# Patient Record
Sex: Male | Born: 1995 | Race: White | Hispanic: No | Marital: Single | Smoking: Former smoker
Health system: Southern US, Community
[De-identification: ages and names within clinical notes are randomized; demographics above are authoritative.]

## PROBLEM LIST (undated history)

## (undated) ENCOUNTER — Emergency Department: Admission: EM | Discharge status: Against Medical Advice | Payer: Self-pay | Source: Home / Self Care

## (undated) DIAGNOSIS — R569 Unspecified convulsions: Secondary | ICD-10-CM

## (undated) DIAGNOSIS — J9819 Other pulmonary collapse: Secondary | ICD-10-CM

## (undated) HISTORY — PX: OTHER SURGICAL HISTORY: SHX169

## (undated) HISTORY — PX: TONSILLECTOMY: SUR1361

---
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
## 2015-05-06 ENCOUNTER — Encounter: Payer: Self-pay | Admitting: Emergency Medicine

## 2015-05-06 ENCOUNTER — Emergency Department
Admission: EM | Admit: 2015-05-06 | Discharge: 2015-05-06 | Disposition: A | Discharge status: Home / Self Care | Payer: Medicaid Other | Attending: Emergency Medicine | Admitting: Emergency Medicine

## 2015-05-06 DIAGNOSIS — Z7722 Contact with and (suspected) exposure to environmental tobacco smoke (acute) (chronic): Secondary | ICD-10-CM | POA: Insufficient documentation

## 2015-05-06 DIAGNOSIS — K0889 Other specified disorders of teeth and supporting structures: Secondary | ICD-10-CM | POA: Insufficient documentation

## 2015-05-06 DIAGNOSIS — R569 Unspecified convulsions: Secondary | ICD-10-CM | POA: Diagnosis not present

## 2015-05-06 DIAGNOSIS — K029 Dental caries, unspecified: Secondary | ICD-10-CM

## 2015-05-06 MED ORDER — PENICILLIN V POTASSIUM 500 MG PO TABS: 500.0000 mg | ORAL_TABLET | Freq: Four times a day (QID) | ORAL | Status: DC

## 2015-05-06 MED ORDER — IBUPROFEN 800 MG PO TABS
800.0000 mg | ORAL_TABLET | Freq: Once | ORAL | Status: AC
  Administered 2015-05-06: 800 mg via ORAL
  Filled 2015-05-06: qty 1

## 2015-05-06 MED ORDER — IBUPROFEN 800 MG PO TABS: 800.0000 mg | ORAL_TABLET | Freq: Three times a day (TID) | ORAL | Status: DC | PRN

## 2015-05-06 NOTE — ED Notes (Signed)
Pt reports right upper side of mouth pain since last night, reports hole in tooth.

## 2015-05-06 NOTE — ED Provider Notes (Signed)
[facility] Emergency Department Provider Note ____________________________________________  Time seen: 1515  I have reviewed the triage vital signs and the nursing notes.  HISTORY  Chief Complaint  Dental Pain  HPI Jesse Graves is a 20 y.o. male presents to the ED for evaluation and management of pain to the right upper molar with onset of pain yesterday. He describes he was seen by the provider about 6 months earlier and had a filling placed in this right second molar. He claims he attempted to call the dental provider at some point following the procedure, to have the filling removed. He presents now with increased pain to the right upper jaw without obvious abscess, draining, or acute dental fracture.He ninth entered fevers, chills, sweats. He rates pain and discomfort at a 10/10 in triage.  Past Medical History  Diagnosis Date  . Seizures (HCC)     There are no active problems to display for this patient.   Past Surgical History  Procedure Laterality Date  . Tonsillectomy    . Tubes in ears      Current Outpatient Rx  Name  Route  Sig  Dispense  Refill  . cephALEXin (KEFLEX) 500 MG capsule   Oral   Take 1 capsule (500 mg total) by mouth 2 (two) times daily.   20 capsule   0   . ibuprofen (ADVIL,MOTRIN) 800 MG tablet   Oral   Take 1 tablet (800 mg total) by mouth every 8 (eight) hours as needed.   30 tablet   0   . ondansetron (ZOFRAN) 4 MG tablet   Oral   Take 1 tablet (4 mg total) by mouth every 6 (six) hours as needed for nausea or vomiting.   15 tablet   0   . penicillin v potassium (VEETID) 500 MG tablet   Oral   Take 1 tablet (500 mg total) by mouth 4 (four) times daily.   40 tablet   0   . sulfamethoxazole-trimethoprim (BACTRIM DS,SEPTRA DS) 800-160 MG tablet   Oral   Take 2 tablets by mouth 2 (two) times daily.   40 tablet   0    Allergies Review of patient's allergies indicates no known allergies.  No family  history on file.  Social History Social History  Substance Use Topics  . Smoking status: Passive Smoke Exposure - Never Smoker  . Smokeless tobacco: Current User  . Alcohol Use: No    Review of Systems  Constitutional: Negative for fever. Eyes: Negative for visual changes. ENT: Negative for sore throat. Dental pain as above. Cardiovascular: Negative for chest pain. Respiratory: Negative for shortness of breath. Musculoskeletal: Negative for back pain. Skin: Negative for rash. Neurological: Negative for headaches, focal weakness or numbness. ____________________________________________  PHYSICAL EXAM:  VITAL SIGNS: ED Triage Vitals  Enc Vitals Group     BP 05/06/15 1341 123/96 mmHg     Pulse Rate 05/06/15 1341 79     Resp 05/06/15 1341 16     Temp 05/06/15 1341 97.6 F (36.4 C)     Temp Source 05/06/15 1341 Oral     SpO2 05/06/15 1341 98 %     Weight 05/06/15 1341 140 lb (63.504 kg)     Height 05/06/15 1341 6\' 2"  (1.88 m)     Head Cir --      Peak Flow --      Pain Score 05/06/15 1342 10     Pain Loc --      Pain  Edu? --      Excl. in GC? --    Constitutional: Alert and oriented. Well appearing and in no distress. Head: Normocephalic and atraumatic.      Eyes: Conjunctivae are normal. PERRL. Normal extraocular movements      Ears: Canals clear. TMs intact bilaterally.   Nose: No congestion/rhinorrhea.   Mouth/Throat: Mucous membranes are moist. Uvula is midline and tonsils are flat. Patient with an obvious cavity to the occlusal surface of the right second molar. He appears to have a composite filling in place, but there appears to be chronic fracture to the remainder of the tooth on the biting surface. Patient also has a buccal mucosal aphthosis ulcer at the second molar. There is no obvious gum swelling, pointing, or abscess appreciated.   Neck: Supple. No thyromegaly. Hematological/Lymphatic/Immunological: No cervical lymphadenopathy. Cardiovascular:  Normal rate, regular rhythm.  Respiratory: Normal respiratory effort. No wheezes/rales/rhonchi. Musculoskeletal: Nontender with normal range of motion in all extremities.  Neurologic:  Normal gait without ataxia. Normal speech and language. No gross focal neurologic deficits are appreciated. Skin:  Skin is warm, dry and intact. No rash noted. ____________________________________________  PROCEDURES  IBU 800 mg PO ____________________________________________  INITIAL IMPRESSION / ASSESSMENT AND PLAN / ED COURSE  Patient with acute dental pain to the right upper molar with underlying dental caries. He is advised to return to his previous treating dentist for further evaluation management. He is being discharged with a prescription for 800 mg ibuprofen as well as Pen-Vee K to dose as directed. He is to follow-up as directed. ____________________________________________  FINAL CLINICAL IMPRESSION(S) / ED DIAGNOSES  Final diagnoses:  Pain due to dental caries      Lissa Hoard, PA-C 05/06/15 1536  Jeanmarie Plant, MD 05/07/15 1350

## 2015-05-06 NOTE — Discharge Instructions (Signed)
Dental Caries Dental caries is tooth decay. This decay can cause a hole in teeth (cavity) that can get bigger and deeper over time. HOME CARE  Brush and floss your teeth. Do this at least two times a day.  Use a fluoride toothpaste.  Use a mouth rinse if told by your dentist or doctor.  Eat less sugary and starchy foods. Drink less sugary drinks.  Avoid snacking often on sugary and starchy foods. Avoid sipping often on sugary drinks.  Keep regular checkups and cleanings with your dentist.  Use fluoride supplements if told by your dentist or doctor.  Allow fluoride to be applied to teeth if told by your dentist or doctor.   This information is not intended to replace advice given to you by your health care provider. Make sure you discuss any questions you have with your health care provider.   Document Released: 11/03/2007 Document Revised: 02/14/2014 Document Reviewed: 01/27/2012 Elsevier Interactive Patient Education 2016 Elsevier Inc.  Dental Pain Dental pain may be caused by many things, including:  Tooth decay (cavities or caries). Cavities expose the nerve of your tooth to air and hot or cold temperatures. This can cause pain or discomfort.  Abscess or infection. A dental abscess is a collection of infected pus from a bacterial infection in the inner part of the tooth (pulp). It usually occurs at the end of the tooth's root.  Injury.  An unknown reason (idiopathic). Your pain may be mild or severe. It may only occur when:  You are chewing.  You are exposed to hot or cold temperature.  You are eating or drinking sugary foods or beverages, such as soda or candy. Your pain may also be constant. HOME CARE INSTRUCTIONS Watch your dental pain for any changes. The following actions may help to lessen any discomfort that you are feeling:  Take medicines only as directed by your dentist.  If you were prescribed an antibiotic medicine, finish all of it even if you start to  feel better.  Keep all follow-up visits as directed by your dentist. This is important.  Do not apply heat to the outside of your face.  Rinse your mouth or gargle with salt water if directed by your dentist. This helps with pain and swelling.  You can make salt water by adding  tsp of salt to 1 cup of warm water.  Apply ice to the painful area of your face:  Put ice in a plastic bag.  Place a towel between your skin and the bag.  Leave the ice on for 20 minutes, 2-3 times per day.  Avoid foods or drinks that cause you pain, such as:  Very hot or very cold foods or drinks.  Sweet or sugary foods or drinks. SEEK MEDICAL CARE IF:  Your pain is not controlled with medicines.  Your symptoms are worse.  You have new symptoms. SEEK IMMEDIATE MEDICAL CARE IF:  You are unable to open your mouth.  You are having trouble breathing or swallowing.  You have a fever.  Your face, neck, or jaw is swollen.   This information is not intended to replace advice given to you by your health care provider. Make sure you discuss any questions you have with your health care provider.   Document Released: 01/24/2005 Document Revised: 06/10/2014 Document Reviewed: 01/20/2014 Elsevier Interactive Patient Education [employer]2016 Elsevier Inc.   Follow-up with one of the dental clinics on the list provider. Take the antibiotic as directed until completely gone.

## 2015-11-09 ENCOUNTER — Emergency Department
Admission: EM | Admit: 2015-11-09 | Discharge: 2015-11-09 | Disposition: A | Discharge status: Home / Self Care | Payer: Medicaid Other | Attending: Emergency Medicine | Admitting: Emergency Medicine

## 2015-11-09 ENCOUNTER — Emergency Department: Payer: Medicaid Other

## 2015-11-09 ENCOUNTER — Encounter: Payer: Self-pay | Admitting: *Deleted

## 2015-11-09 DIAGNOSIS — Y929 Unspecified place or not applicable: Secondary | ICD-10-CM | POA: Insufficient documentation

## 2015-11-09 DIAGNOSIS — F17210 Nicotine dependence, cigarettes, uncomplicated: Secondary | ICD-10-CM | POA: Diagnosis not present

## 2015-11-09 DIAGNOSIS — S99922A Unspecified injury of left foot, initial encounter: Secondary | ICD-10-CM | POA: Diagnosis present

## 2015-11-09 DIAGNOSIS — Y9389 Activity, other specified: Secondary | ICD-10-CM | POA: Insufficient documentation

## 2015-11-09 DIAGNOSIS — S9032XA Contusion of left foot, initial encounter: Secondary | ICD-10-CM

## 2015-11-09 DIAGNOSIS — Y999 Unspecified external cause status: Secondary | ICD-10-CM | POA: Diagnosis not present

## 2015-11-09 DIAGNOSIS — W3182XA Contact with other commercial machinery, initial encounter: Secondary | ICD-10-CM | POA: Insufficient documentation

## 2015-11-09 DIAGNOSIS — S92255A Nondisplaced fracture of navicular [scaphoid] of left foot, initial encounter for closed fracture: Secondary | ICD-10-CM | POA: Diagnosis not present

## 2015-11-09 MED ORDER — HYDROCODONE-ACETAMINOPHEN 5-325 MG PO TABS: 1.0000 | ORAL_TABLET | Freq: Four times a day (QID) | ORAL | 0 refills | Status: DC | PRN

## 2015-11-09 MED ORDER — HYDROCODONE-ACETAMINOPHEN 5-325 MG PO TABS
1.0000 | ORAL_TABLET | ORAL | Status: AC
  Administered 2015-11-09: 1 via ORAL
  Filled 2015-11-09: qty 1

## 2015-11-09 MED ORDER — IBUPROFEN 800 MG PO TABS: 800.0000 mg | ORAL_TABLET | Freq: Three times a day (TID) | ORAL | 0 refills | Status: DC | PRN

## 2015-11-09 NOTE — ED Provider Notes (Signed)
[facility]-EMERGENCY DEPARTMENT Provider Note   CSN: [id number] Arrival date & time: 11/09/15  1951     History   Chief Complaint Chief Complaint  Patient presents with  . Foot Injury    HPI Jesse Graves is a 20 y.o. male presents to the emergency department for evaluation of left foot injury. Patient states around 4 PM today, he dropped a dryer onto his left foot. Pain is 10 out of 10. He is unable to ambulate. He has significant swelling to the dorsal aspect of the mid left foot. He has not had any medications for pain.   HPI  Past Medical History:  Diagnosis Date  . Seizures (HCC)     There are no active problems to display for this patient.   Past Surgical History:  Procedure Laterality Date  . TONSILLECTOMY    . tubes in ears         Home Medications    Prior to Admission medications   Medication Sig Start Date End Date Taking? Authorizing Provider  cephALEXin (KEFLEX) 500 MG capsule Take 1 capsule (500 mg total) by mouth 2 (two) times daily. 06/27/14   Darci Current, MD  HYDROcodone-acetaminophen (NORCO) 5-325 MG tablet Take 1 tablet by mouth every 6 (six) hours as needed for moderate pain. 11/09/15   Evon Slack, PA-C  ibuprofen (ADVIL,MOTRIN) 800 MG tablet Take 1 tablet (800 mg total) by mouth every 8 (eight) hours as needed. 11/09/15   Evon Slack, PA-C  ondansetron (ZOFRAN) 4 MG tablet Take 1 tablet (4 mg total) by mouth every 6 (six) hours as needed for nausea or vomiting. 10/30/14   Charlesetta Ivory Menshew, PA-C  penicillin v potassium (VEETID) 500 MG tablet Take 1 tablet (500 mg total) by mouth 4 (four) times daily. 05/06/15   Jenise V Bacon Menshew, PA-C  sulfamethoxazole-trimethoprim (BACTRIM DS,SEPTRA DS) 800-160 MG tablet Take 2 tablets by mouth 2 (two) times daily. 11/06/14   Irean Hong, MD    Family History History reviewed. No pertinent family history.  Social History Social History  Substance Use Topics  . Smoking status: Current Every  Day Smoker    Types: Cigarettes  . Smokeless tobacco: Current User  . Alcohol use No     Allergies   Review of patient's allergies indicates no known allergies.   Review of Systems Review of Systems  Constitutional: Negative.  Negative for activity change, appetite change, chills and fever.  HENT: Negative for congestion, ear pain, mouth sores, rhinorrhea, sinus pressure, sore throat and trouble swallowing.   Eyes: Negative for photophobia, pain and discharge.  Respiratory: Negative for cough, chest tightness and shortness of breath.   Cardiovascular: Negative for chest pain and leg swelling.  Gastrointestinal: Negative for abdominal distention, abdominal pain, diarrhea, nausea and vomiting.  Genitourinary: Negative for difficulty urinating and dysuria.  Musculoskeletal: Positive for arthralgias and joint swelling. Negative for back pain and gait problem.  Skin: Negative for color change and rash.  Neurological: Negative for dizziness and headaches.  Hematological: Negative for adenopathy.  Psychiatric/Behavioral: Negative for agitation and behavioral problems.     Physical Exam Updated Vital Signs BP 134/85 (BP Location: Right Arm)   Pulse 69   Temp 97.8 F (36.6 C) (Oral)   Resp 16   Ht 6\' 1"  (1.854 m)   Wt 61.2 kg   SpO2 100%   BMI 17.81 kg/m   Physical Exam  Constitutional: He appears well-developed and well-nourished.  HENT:  Head: Normocephalic  and atraumatic.  Eyes: Conjunctivae are normal.  Neck: Neck supple.  Cardiovascular: Normal rate and intact distal pulses.   Pulmonary/Chest: Effort normal. No respiratory distress.  Musculoskeletal:  Examination of the left foot shows patient has ecchymosis and swelling to the dorsal aspect of the midfoot. There is no skin breakdown noted. Sensation is intact distally with 2+ dorsalis pedis pulse. He has no swelling or tenderness about the medial or lateral malleolus. There is no Achilles tendon tenderness. He is  nontender throughout the calcaneus. No fifth metatarsal Tenderness.  Neurological: He is alert.  Skin: Skin is warm and dry.  Psychiatric: He has a normal mood and affect.  Nursing note and vitals reviewed.    ED Treatments / Results  Labs (all labs ordered are listed, but only abnormal results are displayed) Labs Reviewed - No data to display  EKG  EKG Interpretation None       Radiology Dg Foot Complete Left  Result Date: 11/09/2015 CLINICAL DATA:  Initial encounter for Pt dropped a dryer on his L foot today. Pt presents w/ bruising and swelling to L upper foot and instep. Pt unable to bear weight on L foot. Palpable pulses DP and PT. Pt denies alteration in sensation and can move toes w/ pain. No hx of same. EXAM: LEFT FOOT - COMPLETE 3+ VIEW COMPARISON:  Ankle films of 10/16/2006 FINDINGS: Hallux valgus deformity is mild. Subtle lucency about the navicula most apparent on the AP view. No other focal osseous abnormality identified. IMPRESSION: Subtle osseous irregularity about the navicula. This could represent accessory ossification center. Correlate with point tenderness to exclude nondisplaced fracture. Electronically Signed   By: Jeronimo GreavesKyle  Graves M.D.   On: 11/09/2015 21:10    Procedures Procedures (including critical care time) SPLINT APPLICATION Date/Time: 10:17 PM Authorized by: Patience MuscaGAINES, Jesse CHRISTOPHER Consent: Verbal consent obtained. Risks and benefits: risks, benefits and alternatives were discussed Consent given by: patient Splint applied by: Nurse Location details: Left foot  Splint type: Postop shoe  Supplies used: Postop shoe crutches  Post-procedure: The splinted body part was neurovascularly unchanged following the procedure. Patient tolerance: Patient tolerated the procedure well with no immediate complications.     Medications Ordered in ED Medications  HYDROcodone-acetaminophen (NORCO/VICODIN) 5-325 MG per tablet 1 tablet (1 tablet Oral Given 11/09/15  2153)     Initial Impression / Assessment and Plan / ED Course  I have reviewed the triage vital signs and the nursing notes.  Pertinent labs & imaging results that were available during my care of the patient were reviewed by me and considered in my medical decision making (see chart for details).  Clinical Course    [age] year old male with possible avulsion nondisplaced fracture to the navicular bone. Patient's place into a postop shoe given crutches, he'll rest ice elevate. Follow-up with podiatry. Return to the ER for any worsening symptoms urgent changes in his health.  Final Clinical Impressions(s) / ED Diagnoses   Final diagnoses:  Closed nondisplaced fracture of navicular bone of left foot, initial encounter  Contusion of left foot, initial encounter    New Prescriptions New Prescriptions   HYDROCODONE-ACETAMINOPHEN (NORCO) 5-325 MG TABLET    Take 1 tablet by mouth every 6 (six) hours as needed for moderate pain.   IBUPROFEN (ADVIL,MOTRIN) 800 MG TABLET    Take 1 tablet (800 mg total) by mouth every 8 (eight) hours as needed.     Evon Slackhomas C Gaines, PA-C 11/09/15 2218    Rockne MenghiniAnne-Caroline Norman, MD 11/10/15 [phone]

## 2015-11-09 NOTE — Discharge Instructions (Signed)
Rest ice and elevate left lower extremity. Stay nonweightbearing. Use Norco as needed for severe pain, use ibuprofen for mild to moderate pain. Call podiatry Tuesday or Wednesday of this week for later follow-up appointment.

## 2015-11-09 NOTE — ED Triage Notes (Signed)
Pt dropped a dryer on his L foot. Pt presents w/ bruising and swelling to L upper foot and instep. Pt unable to bear weight on L foot. Palpable pulses DP and PT. Pt denies alteration in sensation and can move toes w/ pain.

## 2016-05-18 ENCOUNTER — Emergency Department: Payer: Medicaid Other

## 2016-05-18 ENCOUNTER — Encounter: Payer: Self-pay | Admitting: Emergency Medicine

## 2016-05-18 ENCOUNTER — Emergency Department
Admission: EM | Admit: 2016-05-18 | Discharge: 2016-05-18 | Disposition: A | Discharge status: Home / Self Care | Payer: Medicaid Other | Attending: Emergency Medicine | Admitting: Emergency Medicine

## 2016-05-18 DIAGNOSIS — M722 Plantar fascial fibromatosis: Secondary | ICD-10-CM | POA: Diagnosis not present

## 2016-05-18 DIAGNOSIS — B351 Tinea unguium: Secondary | ICD-10-CM | POA: Diagnosis not present

## 2016-05-18 DIAGNOSIS — B353 Tinea pedis: Secondary | ICD-10-CM | POA: Diagnosis not present

## 2016-05-18 DIAGNOSIS — F17210 Nicotine dependence, cigarettes, uncomplicated: Secondary | ICD-10-CM | POA: Diagnosis not present

## 2016-05-18 DIAGNOSIS — M79671 Pain in right foot: Secondary | ICD-10-CM | POA: Diagnosis present

## 2016-05-18 LAB — GLUCOSE, CAPILLARY: GLUCOSE-CAPILLARY: 101 mg/dL — AB (ref 65–99)

## 2016-05-18 MED ORDER — NAPROXEN 500 MG PO TABS
500.0000 mg | ORAL_TABLET | Freq: Once | ORAL | Status: AC
  Administered 2016-05-18: 500 mg via ORAL
  Filled 2016-05-18: qty 1

## 2016-05-18 MED ORDER — TRAMADOL HCL 50 MG PO TABS: 50.0000 mg | ORAL_TABLET | Freq: Four times a day (QID) | ORAL | 0 refills | Status: DC | PRN

## 2016-05-18 MED ORDER — TRAMADOL HCL 50 MG PO TABS
50.0000 mg | ORAL_TABLET | Freq: Once | ORAL | Status: AC
  Administered 2016-05-18: 50 mg via ORAL
  Filled 2016-05-18: qty 1

## 2016-05-18 MED ORDER — TOLNAFTATE 1 % EX CREA: 1.0000 "application " | TOPICAL_CREAM | Freq: Two times a day (BID) | CUTANEOUS | 0 refills | Status: DC

## 2016-05-18 MED ORDER — NAPROXEN 500 MG PO TABS: 500.0000 mg | ORAL_TABLET | Freq: Two times a day (BID) | ORAL | Status: DC

## 2016-05-18 NOTE — ED Notes (Signed)
X-ray at bedside

## 2016-05-18 NOTE — ED Triage Notes (Signed)
Pt c/o right heel pain for about two weeks. No known injury, has not taken anything for the pain .

## 2016-05-18 NOTE — ED Provider Notes (Signed)
[facility] Emergency Department Provider Note   ____________________________________________   First MD Initiated Contact with Patient 05/18/16 0935     (approximate)  I have reviewed the triage vital signs and the nursing notes.   HISTORY  Chief Complaint Foot Pain    HPI Jesse Graves is a 21 y.o. male patient complaining of right heel pain for 2 weeks. Patient denies any provocative incident for this complaint. No palliative measures for this complaint.Patient rates pain 8/10.   Past Medical History:  Diagnosis Date  . Seizures (HCC)     There are no active problems to display for this patient.   Past Surgical History:  Procedure Laterality Date  . TONSILLECTOMY    . tubes in ears      Prior to Admission medications   Medication Sig Start Date End Date Taking? Authorizing Provider  cephALEXin (KEFLEX) 500 MG capsule Take 1 capsule (500 mg total) by mouth 2 (two) times daily. 06/27/14   Darci Current, MD  HYDROcodone-acetaminophen (NORCO) 5-325 MG tablet Take 1 tablet by mouth every 6 (six) hours as needed for moderate pain. 11/09/15   Evon Slack, PA-C  ibuprofen (ADVIL,MOTRIN) 800 MG tablet Take 1 tablet (800 mg total) by mouth every 8 (eight) hours as needed. 11/09/15   Evon Slack, PA-C  naproxen (NAPROSYN) 500 MG tablet Take 1 tablet (500 mg total) by mouth 2 (two) times daily with a meal. 05/18/16   Joni Reining, PA-C  ondansetron (ZOFRAN) 4 MG tablet Take 1 tablet (4 mg total) by mouth every 6 (six) hours as needed for nausea or vomiting. 10/30/14   Charlesetta Ivory Menshew, PA-C  penicillin v potassium (VEETID) 500 MG tablet Take 1 tablet (500 mg total) by mouth 4 (four) times daily. 05/06/15   Jenise V Bacon Menshew, PA-C  sulfamethoxazole-trimethoprim (BACTRIM DS,SEPTRA DS) 800-160 MG tablet Take 2 tablets by mouth 2 (two) times daily. 11/06/14   Irean Hong, MD  tolnaftate (TINACTIN) 1 % cream Apply 1 application topically 2  (two) times daily. 05/18/16   Joni Reining, PA-C  traMADol (ULTRAM) 50 MG tablet Take 1 tablet (50 mg total) by mouth every 6 (six) hours as needed for moderate pain. 05/18/16   Joni Reining, PA-C    Allergies Patient has no known allergies.  No family history on file.  Social History Social History  Substance Use Topics  . Smoking status: Current Every Day Smoker    Types: Cigarettes  . Smokeless tobacco: Current User  . Alcohol use No    Review of Systems Constitutional: No fever/chills Eyes: No visual changes. ENT: No sore throat. Cardiovascular: Denies chest pain. Respiratory: Denies shortness of breath. Gastrointestinal: No abdominal pain.  No nausea, no vomiting.  No diarrhea.  No constipation. Genitourinary: Negative for dysuria. Musculoskeletal: Negative for back pain. Skin: Negative for rash. Posterior/ plantar heel edema Neurological: Negative for headaches, focal weakness or numbness.    ____________________________________________   PHYSICAL EXAM:  VITAL SIGNS: ED Triage Vitals  Enc Vitals Group     BP 05/18/16 0908 131/78     Pulse Rate 05/18/16 0908 84     Resp 05/18/16 0908 18     Temp 05/18/16 0908 97.6 F (36.4 C)     Temp Source 05/18/16 0908 Oral     SpO2 05/18/16 0908 100 %     Weight 05/18/16 0909 140 lb (63.5 kg)     Height 05/18/16 0909  (1.854  m)     Head Circumference --      Peak Flow --      Pain Score 05/18/16 0908 8     Pain Loc --      Pain Edu? --      Excl. in GC? --     Constitutional: Alert and oriented. Well appearing and in no acute distress. Eyes: Conjunctivae are normal. PERRL. EOMI. Head: Atraumatic. Nose: No congestion/rhinnorhea. Mouth/Throat: Mucous membranes are moist.  Oropharynx non-erythematous. Neck: No stridor.  No cervical spine tenderness to palpation. Hematological/Lymphatic/Immunilogical: No cervical lymphadenopathy. Cardiovascular: Normal rate, regular rhythm. Grossly normal heart sounds.  Good  peripheral circulation. Respiratory: Normal respiratory effort.  No retractions. Lungs CTAB. Gastrointestinal: Soft and nontender. No distention. No abdominal bruits. No CVA tenderness. Genitourinary:  Musculoskeletal: No lower extremity tenderness nor edema.  No joint effusions. Neurologic:  Normal speech and language. No gross focal neurologic deficits are appreciated. No gait instability. Skin:  Skin is warm, dry and intact. No rash noted. Malororous feet with hypertrophic nails Psychiatric: Mood and affect are normal. Speech and behavior are normal.  ____________________________________________   LABS (all labs ordered are listed, but only abnormal results are displayed)  Labs Reviewed  GLUCOSE, CAPILLARY - Abnormal; Notable for the following:       Result Value   Glucose-Capillary 101 (*)    All other components within normal limits   ____________________________________________  EKG   ____________________________________________  RADIOLOGY   ____________________________________________   PROCEDURES  Procedure(s) performed: None  Procedures  Critical Care performed: No  ____________________________________________   INITIAL IMPRESSION / ASSESSMENT AND PLAN / ED COURSE  Pertinent labs & imaging results that were available during my care of the patient were reviewed by me and considered in my medical decision making (see chart for details).  Plantar fasciitis, tinea pedis, and Onychomycosis.      ____________________________________________   FINAL CLINICAL IMPRESSION(S) / ED DIAGNOSES  Final diagnoses:  Plantar fasciitis, bilateral  Tinea pedis of both feet  Onychomycosis  Patient given discharge Instructions. Patient given a work note. Patient advised follow-up with PCP and podiatry if condition persists.    NEW MEDICATIONS STARTED DURING THIS VISIT:  New Prescriptions   NAPROXEN (NAPROSYN) 500 MG TABLET    Take 1 tablet (500 mg total) by  mouth 2 (two) times daily with a meal.   TOLNAFTATE (TINACTIN) 1 % CREAM    Apply 1 application topically 2 (two) times daily.   TRAMADOL (ULTRAM) 50 MG TABLET    Take 1 tablet (50 mg total) by mouth every 6 (six) hours as needed for moderate pain.     Note:  This document was prepared using Dragon voice recognition software and may include unintentional dictation errors.    Joni Reining, PA-C 05/18/16 1051    Emily Filbert, MD 05/18/16 [phone]

## 2019-02-08 ENCOUNTER — Emergency Department: Payer: Self-pay

## 2019-02-08 ENCOUNTER — Encounter: Payer: Self-pay | Admitting: Intensive Care

## 2019-02-08 ENCOUNTER — Emergency Department
Admission: EM | Admit: 2019-02-08 | Discharge: 2019-02-08 | Discharge status: Against Medical Advice | Payer: Self-pay | Attending: Emergency Medicine | Admitting: Emergency Medicine

## 2019-02-08 ENCOUNTER — Other Ambulatory Visit: Payer: Self-pay

## 2019-02-08 DIAGNOSIS — F17228 Nicotine dependence, chewing tobacco, with other nicotine-induced disorders: Secondary | ICD-10-CM | POA: Insufficient documentation

## 2019-02-08 DIAGNOSIS — Z5320 Procedure and treatment not carried out because of patient's decision for unspecified reasons: Secondary | ICD-10-CM | POA: Insufficient documentation

## 2019-02-08 DIAGNOSIS — F1210 Cannabis abuse, uncomplicated: Secondary | ICD-10-CM | POA: Insufficient documentation

## 2019-02-08 DIAGNOSIS — R079 Chest pain, unspecified: Secondary | ICD-10-CM | POA: Insufficient documentation

## 2019-02-08 DIAGNOSIS — R109 Unspecified abdominal pain: Secondary | ICD-10-CM | POA: Insufficient documentation

## 2019-02-08 DIAGNOSIS — F17210 Nicotine dependence, cigarettes, uncomplicated: Secondary | ICD-10-CM | POA: Insufficient documentation

## 2019-02-08 LAB — BASIC METABOLIC PANEL
Anion gap: 9 (ref 5–15)
BUN: 17 mg/dL (ref 6–20)
CO2: 26 mmol/L (ref 22–32)
Calcium: 9.5 mg/dL (ref 8.9–10.3)
Chloride: 103 mmol/L (ref 98–111)
Creatinine, Ser: 1.06 mg/dL (ref 0.61–1.24)
GFR calc Af Amer: >60 mL/min (ref >60)
GFR calc non Af Amer: >60 mL/min (ref >60)
Glucose, Bld: 102 mg/dL — ABNORMAL HIGH (ref 70–99)
Potassium: 4.1 mmol/L (ref 3.5–5.1)
Sodium: 138 mmol/L (ref 135–145)

## 2019-02-08 LAB — CBC
HCT: 45 % (ref 39.0–52.0)
Hemoglobin: 16.1 g/dL (ref 13.0–17.0)
MCH: 27.8 pg (ref 26.0–34.0)
MCHC: 35.8 g/dL (ref 30.0–36.0)
MCV: 77.7 fL — ABNORMAL LOW (ref 80.0–100.0)
Platelets: 169 10*3/uL (ref 150–400)
RBC: 5.79 MIL/uL (ref 4.22–5.81)
RDW: 13 % (ref 11.5–15.5)
WBC: 8.4 10*3/uL (ref 4.0–10.5)
nRBC: 0 % (ref 0.0–0.2)

## 2019-02-08 LAB — LIPASE, BLOOD: Lipase: 24 U/L (ref 11–51)

## 2019-02-08 LAB — TROPONIN I (HIGH SENSITIVITY): Troponin I (High Sensitivity): <2 ng/L (ref <18)

## 2019-02-08 NOTE — ED Provider Notes (Signed)
[facility] Emergency Department Provider Note  ____________________________________________  Time seen: Approximately 3:46 PM  I have reviewed the triage vital signs and the nursing notes.   HISTORY  Chief Complaint Chest Pain    HPI Jesse Graves is a 24 y.o. male that presents to the emergency department for evaluation of right-sided chest pain for 2 days and left sided flank pain for 1 day.  Pain has been constant and feels like a cramping sensation.  Pain is worse with movement.  Patient states that it currently hurts a "little bit." Patient states that he works in [employer] and is unsure if he pulled a muscle.  His partner and child were sick with a URI 2 weeks ago but tested negative for Covid at that time.  Patient has not felt ill.  Patient smokes a half a pack of cigarettes per day.  No fevers, nasal congestion, sore throat, shortness of breath, cough, vomiting, diarrhea, urinary symptoms.   Past Medical History:  Diagnosis Date  . Seizures ([place])     There are no problems to display for this patient.   Past Surgical History:  Procedure Laterality Date  . TONSILLECTOMY    . tubes in ears      Prior to Admission medications   Not on File    Allergies Patient has no known allergies.  History reviewed. No pertinent family history.  Social History Social History   Tobacco Use  . Smoking status: Current Every Day Smoker    Types: Cigarettes  . Smokeless tobacco: Current User  Substance Use Topics  . Alcohol use: No  . Drug use: Yes    Types: Marijuana     Review of Systems  Constitutional: No fever/chills ENT: No upper respiratory complaints. Cardiovascular: Positive for chest pain. Respiratory: No cough. No SOB. Gastrointestinal: Positive for left flank pain.  No nausea, no vomiting.  Genitourinary: Negative for dysuria. Musculoskeletal: Negative for musculoskeletal pain. Skin: Negative for rash, abrasions, lacerations,  ecchymosis. Neurological: Negative for headaches   ____________________________________________   PHYSICAL EXAM:  VITAL SIGNS: ED Triage Vitals  Enc Vitals Group     BP 02/08/19 1506 132/75     Pulse Rate 02/08/19 1506 88     Resp 02/08/19 1506 16     Temp 02/08/19 1506 98.8 F (37.1 C)     Temp Source 02/08/19 1506 Oral     SpO2 02/08/19 1506 100 %     Weight 02/08/19 1507 140 lb (63.5 kg)     Height 02/08/19 1507 6\' 3"  (1.905 m)     Head Circumference --      Peak Flow --      Pain Score 02/08/19 1507 5     Pain Loc --      Pain Edu? --      Excl. in [place]? --      Constitutional: Alert and oriented. Well appearing and in no acute distress. Eyes: Conjunctivae are normal. PERRL. EOMI. Head: Atraumatic. ENT:      Ears:      Nose: No congestion/rhinnorhea.      Mouth/Throat: Mucous membranes are moist.  Neck: No stridor.   Cardiovascular: Normal rate, regular rhythm.  Good peripheral circulation. Respiratory: Normal respiratory effort without tachypnea or retractions. Lungs CTAB. Good air entry to the bases with no decreased or absent breath sounds. Gastrointestinal: Bowel sounds 4 quadrants. Soft and nontender to palpation. No guarding or rigidity. No palpable masses. No distention. No CVA tenderness. Musculoskeletal: Full range  of motion to all extremities. No gross deformities appreciated.  Mild tenderness to palpation to right chest wall.  Mild tenderness to palpation to left inferior lateral ribs and left flank.  Normal gait. Neurologic:  Normal speech and language. No gross focal neurologic deficits are appreciated.  Skin:  Skin is warm, dry and intact. No rash noted. Psychiatric: Mood and affect are normal. Speech and behavior are normal.   ____________________________________________   LABS (all labs ordered are listed, but only abnormal results are displayed)  Labs Reviewed  BASIC METABOLIC PANEL - Abnormal; Notable for the following components:      Result  Value   Glucose, Bld 102 (*)    All other components within normal limits  CBC - Abnormal; Notable for the following components:   MCV 77.7 (*)    All other components within normal limits  LIPASE, BLOOD  URINALYSIS, COMPLETE (UACMP) WITH MICROSCOPIC  TROPONIN I (HIGH SENSITIVITY)  TROPONIN I (HIGH SENSITIVITY)   ____________________________________________  EKG   ____________________________________________  RADIOLOGY Lexine Baton, personally viewed and evaluated these images (plain radiographs) as part of my medical decision making, as well as reviewing the written report by the radiologist.  DG Chest 2 View  Result Date: 02/08/2019 CLINICAL DATA:  Patient c/o right sided chest pain X2 days. EXAM: CHEST - 2 VIEW COMPARISON:  None. FINDINGS: The heart size and mediastinal contours are within normal limits. There is mild coarsening of the interstitium bilaterally likely due to tobacco use. No focal infiltrate. No pneumothorax or pleural effusion. The visualized skeletal structures are unremarkable. IMPRESSION: 1.  No evidence of active disease. 2. Coarsening of the interstitium likely representing chronic bronchitic change. Electronically Signed   By: Emmaline Kluver M.D.   On: 02/08/2019 15:29    ____________________________________________    PROCEDURES  Procedure(s) performed:    Procedures    Medications - No data to display   ____________________________________________   INITIAL IMPRESSION / ASSESSMENT AND PLAN / ED COURSE  Pertinent labs & imaging results that were available during my care of the patient were reviewed by me and considered in my medical decision making (see chart for details).  Review of the [place] CSRS was performed in accordance of the [facility] prior to dispensing any controlled drugs.   Patient presented to the emergency department for evaluation of right-sided chest pain and left flank pain.  Vital signs and exam are reassuring.  Lab work  including CBC, CMP, lipase, troponin are largely unremarkable.  Chest x-ray consistent with chronic coarsening of the interstitium.  Chest x-ray findings were discussed with the patient.  Patient overall appears well and is nontoxic.  Patient eloped the Emergency Department.   Jesse Graves was evaluated in Emergency Department on 02/08/2019 for the symptoms described in the history of present illness. He was evaluated in the context of the global COVID-19 pandemic, which necessitated consideration that the patient might be at risk for infection with the SARS-CoV-2 virus that causes COVID-19. Institutional protocols and algorithms that pertain to the evaluation of patients at risk for COVID-19 are in a state of rapid change based on information released by regulatory bodies including the CDC and federal and state organizations. These policies and algorithms were followed during the patient's care in the ED.  ____________________________________________  FINAL CLINICAL IMPRESSION(S) / ED DIAGNOSES  Final diagnoses:  Chest pain, unspecified type      NEW MEDICATIONS STARTED DURING THIS VISIT:  ED Discharge Orders    None  This chart was dictated using voice recognition software/Dragon. Despite best efforts to proofread, errors can occur which can change the meaning. Any change was purely unintentional.    Enid Derry, PA-C 02/08/19 2142    Sharman Cheek, MD 02/08/19 2211

## 2019-02-08 NOTE — ED Triage Notes (Signed)
Patient c/o right sided chest pain X2 days. No radiation.

## 2019-05-08 ENCOUNTER — Inpatient Hospital Stay
Admission: EM | Admit: 2019-05-08 | Discharge: 2019-05-10 | DRG: 201 | Disposition: A | Discharge status: Home / Self Care | Payer: Self-pay | Attending: Cardiothoracic Surgery | Admitting: Cardiothoracic Surgery

## 2019-05-08 ENCOUNTER — Emergency Department: Payer: Self-pay

## 2019-05-08 ENCOUNTER — Encounter: Payer: Self-pay | Admitting: Emergency Medicine

## 2019-05-08 ENCOUNTER — Other Ambulatory Visit: Payer: Self-pay

## 2019-05-08 DIAGNOSIS — J939 Pneumothorax, unspecified: Secondary | ICD-10-CM | POA: Diagnosis present

## 2019-05-08 DIAGNOSIS — F17290 Nicotine dependence, other tobacco product, uncomplicated: Secondary | ICD-10-CM | POA: Diagnosis present

## 2019-05-08 DIAGNOSIS — R569 Unspecified convulsions: Secondary | ICD-10-CM | POA: Diagnosis present

## 2019-05-08 DIAGNOSIS — J9383 Other pneumothorax: Principal | ICD-10-CM | POA: Diagnosis present

## 2019-05-08 DIAGNOSIS — Z09 Encounter for follow-up examination after completed treatment for conditions other than malignant neoplasm: Secondary | ICD-10-CM

## 2019-05-08 DIAGNOSIS — Z20822 Contact with and (suspected) exposure to covid-19: Secondary | ICD-10-CM | POA: Diagnosis present

## 2019-05-08 LAB — CBC
HCT: 45.7 % (ref 39.0–52.0)
Hemoglobin: 15.8 g/dL (ref 13.0–17.0)
MCH: 28.3 pg (ref 26.0–34.0)
MCHC: 34.6 g/dL (ref 30.0–36.0)
MCV: 81.8 fL (ref 80.0–100.0)
Platelets: 162 10*3/uL (ref 150–400)
RBC: 5.59 MIL/uL (ref 4.22–5.81)
RDW: 12.8 % (ref 11.5–15.5)
WBC: 8.7 10*3/uL (ref 4.0–10.5)
nRBC: 0 % (ref 0.0–0.2)

## 2019-05-08 LAB — RESPIRATORY PANEL BY RT PCR (FLU A&B, COVID)
Influenza A by PCR: NEGATIVE
Influenza B by PCR: NEGATIVE
SARS Coronavirus 2 by RT PCR: NEGATIVE

## 2019-05-08 LAB — BASIC METABOLIC PANEL
Anion gap: 9 (ref 5–15)
BUN: 12 mg/dL (ref 6–20)
CO2: 26 mmol/L (ref 22–32)
Calcium: 9.7 mg/dL (ref 8.9–10.3)
Chloride: 105 mmol/L (ref 98–111)
Creatinine, Ser: 0.98 mg/dL (ref 0.61–1.24)
GFR calc Af Amer: >60 mL/min (ref >60)
GFR calc non Af Amer: >60 mL/min (ref >60)
Glucose, Bld: 90 mg/dL (ref 70–99)
Potassium: 3.6 mmol/L (ref 3.5–5.1)
Sodium: 140 mmol/L (ref 135–145)

## 2019-05-08 LAB — TROPONIN I (HIGH SENSITIVITY)
Troponin I (High Sensitivity): 2 ng/L (ref <18)
Troponin I (High Sensitivity): 2 ng/L (ref <18)

## 2019-05-08 MED ORDER — HYDROCODONE-ACETAMINOPHEN 5-325 MG PO TABS
1.0000 | ORAL_TABLET | ORAL | Status: DC | PRN
  Administered 2019-05-09: 1 via ORAL
  Administered 2019-05-09 – 2019-05-10 (×3): 2 via ORAL
  Filled 2019-05-08 (×2): qty 2
  Filled 2019-05-08: qty 1
  Filled 2019-05-08: qty 2

## 2019-05-08 MED ORDER — TRAMADOL HCL 50 MG PO TABS: 50.0000 mg | ORAL_TABLET | Freq: Four times a day (QID) | ORAL | Status: DC | PRN

## 2019-05-08 MED ORDER — KETOROLAC TROMETHAMINE 30 MG/ML IJ SOLN: 15.0000 mg | Freq: Once | INTRAMUSCULAR | Status: DC

## 2019-05-08 MED ORDER — MORPHINE SULFATE (PF) 4 MG/ML IV SOLN
4.0000 mg | Freq: Once | INTRAVENOUS | Status: AC
  Administered 2019-05-08: 4 mg via INTRAVENOUS
  Filled 2019-05-08: qty 1

## 2019-05-08 MED ORDER — SODIUM CHLORIDE 0.9 % IV SOLN: INTRAVENOUS | Status: DC

## 2019-05-08 MED ORDER — ENOXAPARIN SODIUM 40 MG/0.4ML ~~LOC~~ SOLN
40.0000 mg | SUBCUTANEOUS | Status: DC
  Administered 2019-05-09: 40 mg via SUBCUTANEOUS
  Filled 2019-05-08: qty 0.4

## 2019-05-08 MED ORDER — LIDOCAINE-EPINEPHRINE 2 %-1:100000 IJ SOLN
20.0000 mL | Freq: Once | INTRAMUSCULAR | Status: AC
  Administered 2019-05-08: 20 mL via INTRADERMAL
  Filled 2019-05-08: qty 1

## 2019-05-08 MED ORDER — KETOROLAC TROMETHAMINE 30 MG/ML IJ SOLN
30.0000 mg | Freq: Once | INTRAMUSCULAR | Status: AC
  Administered 2019-05-08: 30 mg via INTRAVENOUS
  Filled 2019-05-08: qty 1

## 2019-05-08 MED ORDER — ONDANSETRON HCL 4 MG/2ML IJ SOLN
4.0000 mg | Freq: Once | INTRAMUSCULAR | Status: AC
  Administered 2019-05-08: 4 mg via INTRAVENOUS
  Filled 2019-05-08: qty 2

## 2019-05-08 MED ORDER — MORPHINE SULFATE (PF) 4 MG/ML IV SOLN
INTRAVENOUS | Status: AC
  Administered 2019-05-08: 4 mg
  Filled 2019-05-08: qty 1

## 2019-05-08 MED ORDER — ONDANSETRON 4 MG PO TBDP: 4.0000 mg | ORAL_TABLET | Freq: Four times a day (QID) | ORAL | Status: DC | PRN

## 2019-05-08 MED ORDER — SODIUM CHLORIDE 0.9% FLUSH: 3.0000 mL | Freq: Once | INTRAVENOUS | Status: DC

## 2019-05-08 MED ORDER — DOCUSATE SODIUM 100 MG PO CAPS: 100.0000 mg | ORAL_CAPSULE | Freq: Two times a day (BID) | ORAL | Status: DC | PRN

## 2019-05-08 MED ORDER — ACETAMINOPHEN 325 MG PO TABS
650.0000 mg | ORAL_TABLET | Freq: Four times a day (QID) | ORAL | Status: DC | PRN
  Administered 2019-05-09: 650 mg via ORAL
  Filled 2019-05-08: qty 2

## 2019-05-08 MED ORDER — MORPHINE SULFATE (PF) 2 MG/ML IV SOLN
2.0000 mg | INTRAVENOUS | Status: DC | PRN
  Administered 2019-05-08 – 2019-05-09 (×2): 2 mg via INTRAVENOUS
  Filled 2019-05-08 (×2): qty 1

## 2019-05-08 MED ORDER — ONDANSETRON HCL 4 MG/2ML IJ SOLN
4.0000 mg | Freq: Four times a day (QID) | INTRAMUSCULAR | Status: DC | PRN
  Administered 2019-05-08: 4 mg via INTRAVENOUS
  Filled 2019-05-08: qty 2

## 2019-05-08 NOTE — ED Notes (Signed)
Pt tolerating procedure well. Pt given 4mg  of morphine for comfort.

## 2019-05-08 NOTE — ED Provider Notes (Signed)
[facility] Emergency Department Provider Note  ____________________________________________   First MD Initiated Contact with Patient 05/08/19 1711     (approximate)  I have reviewed the triage vital signs and the nursing notes.   HISTORY  Chief Complaint Palpitations and Shortness of Breath    HPI Jesse Graves is a 24 y.o. male  Here with SOB. Pt reports that about 3 days ago, while at work, he was bending down and experienced acute onset of fairly severe left upper chest pain. Since then, he's noticed SOB with movement as well as pain with bending forward and coughing. He denies h/o similar episodes. No diaphoresis, nausea, vomiting. He vapes but no longer smokes cigarettes. No fever, chills, or sputm production. No known COVID exposures. No h/o prior lung or heart disease. No alleviating factors other than staying still.       Past Medical History:  Diagnosis Date  . Seizures [facility])     Patient Active Problem List   Diagnosis Date Noted  . Pneumothorax 05/08/2019    Past Surgical History:  Procedure Laterality Date  . TONSILLECTOMY    . tubes in ears      Prior to Admission medications   Not on File    Allergies Patient has no known allergies.  No family history on file.  Social History Social History   Tobacco Use  . Smoking status: Current Every Day Smoker    Types: Cigarettes  . Smokeless tobacco: Current User  Substance Use Topics  . Alcohol use: No  . Drug use: Yes    Types: Marijuana    Review of Systems  Review of Systems  Constitutional: Negative for chills, fatigue and fever.  HENT: Negative for sore throat.   Respiratory: Positive for cough and shortness of breath.   Cardiovascular: Positive for chest pain.  Gastrointestinal: Negative for abdominal pain.  Genitourinary: Negative for flank pain.  Musculoskeletal: Negative for neck pain.  Skin: Negative for rash and wound.  Allergic/Immunologic: Negative  for immunocompromised state.  Neurological: Negative for weakness and numbness.  Hematological: Does not bruise/bleed easily.  All other systems reviewed and are negative.    ____________________________________________  PHYSICAL EXAM:      VITAL SIGNS: ED Triage Vitals  Enc Vitals Group     BP 05/08/19 1611 123/78     Pulse Rate 05/08/19 1611 94     Resp 05/08/19 1611 18     Temp 05/08/19 1611 98.1 F (36.7 C)     Temp Source 05/08/19 1611 Oral     SpO2 05/08/19 1611 99 %     Weight 05/08/19 1612 140 lb (63.5 kg)     Height 05/08/19 1612 6\' 2"  (1.88 m)     Head Circumference --      Peak Flow --      Pain Score 05/08/19 1610 6     Pain Loc --      Pain Edu? --      Excl. in [place]? --      Physical Exam Vitals and nursing note reviewed.  Constitutional:      General: He is not in acute distress.    Appearance: He is well-developed.  HENT:     Head: Normocephalic and atraumatic.  Eyes:     Conjunctiva/sclera: Conjunctivae normal.  Cardiovascular:     Rate and Rhythm: Regular rhythm. Tachycardia present.     Heart sounds: Normal heart sounds. No murmur. No friction rub.  Pulmonary:  Effort: Pulmonary effort is normal. Tachypnea present. No respiratory distress.     Breath sounds: Examination of the left-upper field reveals decreased breath sounds. Examination of the left-middle field reveals decreased breath sounds. Decreased breath sounds present. No wheezing or rales.  Abdominal:     General: There is no distension.     Palpations: Abdomen is soft.     Tenderness: There is no abdominal tenderness.  Musculoskeletal:     Cervical back: Neck supple.  Skin:    General: Skin is warm.     Capillary Refill: Capillary refill takes less than 2 seconds.  Neurological:     Mental Status: He is alert and oriented to person, place, and time.     Motor: No abnormal muscle tone.       ____________________________________________   LABS (all labs ordered are listed,  but only abnormal results are displayed)  Labs Reviewed  RESPIRATORY PANEL BY RT PCR (FLU A&B, COVID)  BASIC METABOLIC PANEL  CBC  HIV ANTIBODY (ROUTINE TESTING W REFLEX)  BASIC METABOLIC PANEL  CBC  TROPONIN I (HIGH SENSITIVITY)  TROPONIN I (HIGH SENSITIVITY)    ____________________________________________  EKG: Normal sinus rhythm, VR 91. PR 134, QRS 100, QTc 437. No acute St elevations or depressions. No signs of acute ischemia or infarct. ________________________________________  RADIOLOGY All imaging, including plain films, CT scans, and ultrasounds, independently reviewed by me, and interpretations confirmed via formal radiology reads.  ED MD interpretation:   CXR: Left apical PTX, moderate in size, no mediastinal shift  Official radiology report(s): DG Chest 2 View  Result Date: 05/08/2019 CLINICAL DATA:  Chest pain. Additional provided: Patient reports palpitations for 3 days, shortness of breath. EXAM: CHEST - 2 VIEW COMPARISON:  Chest radiograph 02/28/2019 FINDINGS: Heart size within normal limits. No evidence of airspace consolidation within the lungs. There is a moderate-sized left apical pneumothorax. No evidence of pleural effusion. No acute bony abnormality. These results were called by telephone at the time of interpretation on 05/08/2019 at 4:39 pm to provider Duffy Bruce , who verbally acknowledged these results. IMPRESSION: Moderate-sized left apical pneumothorax. No significant mediastinal shift at this time. Electronically Signed   By: Kellie Simmering DO   On: 05/08/2019 16:40   DG Chest Portable 1 View  Result Date: 05/08/2019 CLINICAL DATA:  Chest tube placement, left-sided pneumothorax EXAM: PORTABLE CHEST 1 VIEW COMPARISON:  05/08/2019 at 4:20 p.m. FINDINGS: Single frontal view of the chest demonstrates interval placement of a left-sided chest tube, tip coiled over the left apex. Significant reduction in left-sided pneumothorax volume estimated less than 5%  remaining. No airspace disease or effusion. The cardiac silhouette is stable. IMPRESSION: 1. Near complete resolution of left pneumothorax after left chest tube placement. Less than 5% residual pneumothorax. Electronically Signed   By: Randa Ngo M.D.   On: 05/08/2019 19:39    ____________________________________________  PROCEDURES   Procedure(s) performed (including Critical Care):  CHEST TUBE INSERTION  Date/Time: 05/08/2019 9:34 PM Performed by: Duffy Bruce, MD Authorized by: Duffy Bruce, MD   Consent:    Consent obtained:  Written   Consent given by:  Patient   Risks discussed:  Bleeding, damage to surrounding structures, incomplete drainage, infection, nerve damage and pain   Alternatives discussed:  Alternative treatment and referral Pre-procedure details:    Skin preparation:  ChloraPrep   Preparation: Patient was prepped and draped in the usual sterile fashion   Anesthesia (see MAR for exact dosages):    Anesthesia method:  Local infiltration  Local anesthetic:  Lidocaine 1% w/o epi Procedure details:    Placement location:  L anterior   Scalpel size:  11   Tube size (Fr):  12   Ultrasound guidance: no     Tension pneumothorax: no     Tube connected to:  Suction   Drainage characteristics:  Air only   Suture material:  0 silk   Dressing:  Petrolatum-impregnated gauze Post-procedure details:    Post-insertion x-ray findings: tube in good position     Patient tolerance of procedure:  Tolerated well, no immediate complications  .1-3 Lead EKG Interpretation Performed by: Shaune Pollack, MD Authorized by: Shaune Pollack, MD     Interpretation: normal     ECG rate:  90-100   ECG rate assessment: normal     Rhythm: sinus rhythm     Ectopy: none     Conduction: normal   Comments:     Indication: Pneumothorax, chest tube in place .Critical Care Performed by: Shaune Pollack, MD Authorized by: Shaune Pollack, MD   Critical care provider  statement:    Critical care time (minutes):  35   Critical care time was exclusive of:  Separately billable procedures and treating other patients and teaching time   Critical care was necessary to treat or prevent imminent or life-threatening deterioration of the following conditions:  Cardiac failure, circulatory failure and respiratory failure   Critical care was time spent personally by me on the following activities:  Development of treatment plan with patient or surrogate, discussions with consultants, evaluation of patient's response to treatment, examination of patient, obtaining history from patient or surrogate, ordering and performing treatments and interventions, ordering and review of laboratory studies, ordering and review of radiographic studies, pulse oximetry, re-evaluation of patient's condition and review of old charts   I assumed direction of critical care for this patient from another provider in my specialty: no      ____________________________________________  INITIAL IMPRESSION / MDM / ASSESSMENT AND PLAN / ED COURSE  As part of my medical decision making, I reviewed the following data within the electronic MEDICAL RECORD NUMBER Nursing notes reviewed and incorporated, Old chart reviewed, Notes from prior ED visits, and [place] Controlled Substance Database       *JEARL SOTO was evaluated in Emergency Department on 05/08/2019 for the symptoms described in the history of present illness. He was evaluated in the context of the global COVID-19 pandemic, which necessitated consideration that the patient might be at risk for infection with the SARS-CoV-2 virus that causes COVID-19. Institutional protocols and algorithms that pertain to the evaluation of patients at risk for COVID-19 are in a state of rapid change based on information released by regulatory bodies including the CDC and federal and state organizations. These policies and algorithms were followed during the patient's  care in the ED.  Some ED evaluations and interventions may be delayed as a result of limited staffing during the pandemic.*     Medical Decision Making:  24 yo m here with spontaneous left apical pneumothorax. Pt has active CP and mild tachycardia, but no hypotension or signs of significant tension physiology. After informed consent, left chest tube placed by myself. Tolerated well with resolution of majority of PTX on repeat XR. Will admit to Dr. Tonna Boehringer. No bleeding. No tension physiology. Labs otherwise reassuring.  ____________________________________________  FINAL CLINICAL IMPRESSION(S) / ED DIAGNOSES  Final diagnoses:  Spontaneous pneumothorax     MEDICATIONS GIVEN DURING THIS VISIT:  Medications  sodium chloride  flush (NS) 0.9 % injection 3 mL (has no administration in time range)  traMADol (ULTRAM) tablet 50 mg (has no administration in time range)  HYDROcodone-acetaminophen (NORCO/VICODIN) 5-325 MG per tablet 1-2 tablet (has no administration in time range)  morphine 2 MG/ML injection 2 mg (2 mg Intravenous Given 05/08/19 2311)  docusate sodium (COLACE) capsule 100 mg (has no administration in time range)  ondansetron (ZOFRAN-ODT) disintegrating tablet 4 mg ( Oral See Alternative 05/08/19 2310)    Or  ondansetron (ZOFRAN) injection 4 mg (4 mg Intravenous Given 05/08/19 2310)  enoxaparin (LOVENOX) injection 40 mg (has no administration in time range)  0.9 %  sodium chloride infusion ( Intravenous New Bag/Given 05/08/19 2128)  acetaminophen (TYLENOL) tablet 650 mg (has no administration in time range)  morphine 4 MG/ML injection 4 mg (4 mg Intravenous Given 05/08/19 1835)  ondansetron (ZOFRAN) injection 4 mg (4 mg Intravenous Given 05/08/19 1855)  lidocaine-EPINEPHrine (XYLOCAINE W/EPI) 2 %-1:100000 (with pres) injection 20 mL (20 mLs Intradermal Given 05/08/19 1835)  morphine 4 MG/ML injection (4 mg  Given 05/08/19 1855)  ketorolac (TORADOL) 30 MG/ML injection 30 mg (30 mg Intravenous  Given 05/08/19 1943)     ED Discharge Orders    None       Note:  This document was prepared using Dragon voice recognition software and may include unintentional dictation errors.   Shaune Pollack, MD 05/08/19 [phone]

## 2019-05-08 NOTE — ED Notes (Signed)
Pt given a urinal.

## 2019-05-08 NOTE — H&P (Signed)
Subjective:   CC: Pneumothorax  HPI:  Jesse Graves is a 24 y.o. male who was consulted by Ellender Hose for evaluation of above.  First noted a few days ago.  Symptoms include:  Palpitations along with shortness of breath, worsened by bending over.  Gradual worsening of symptoms so came into the emergency department.  Cannot recall any specific instigating factor.  Noted to have spontaneous pneumothorax in the ED, chest tube successfully placed by ED provider.  Surgery consulted for admission and monitoring in-house.  Patient currently states that he has some soreness of the chest tube insertion site but otherwise doing well.   Past Medical History:  has a past medical history of Seizures ([place]).  Past Surgical History:  has a past surgical history that includes Tonsillectomy and tubes in ears.  Family History: Reviewed and not relevant to chief complaint  Social History:  reports that he has been smoking cigarettes. He uses smokeless tobacco. He reports current drug use. Drug: Marijuana. He reports that he does not drink alcohol.  Current Medications: None reported  Allergies:  No Known Allergies  ROS:  General: Denies weight loss, weight gain, fatigue, fevers, chills, and night sweats. Eyes: Denies blurry vision, double vision, eye pain, itchy eyes, and tearing. Ears: Denies hearing loss, earache, and ringing in ears. Nose: Denies sinus pain, congestion, infections, runny nose, and nosebleeds. Mouth/throat: Denies hoarseness, sore throat, bleeding gums, and difficulty swallowing. Heart: Denies chest pain, palpitations, racing heart, irregular heartbeat, leg pain or swelling, and decreased activity tolerance. Respiratory: Denies breathing difficulty, shortness of breath, wheezing, cough, and sputum. GI: Denies change in appetite, heartburn, nausea, vomiting, constipation, diarrhea, and blood in stool. GU: Denies difficulty urinating, pain with urinating, urgency, frequency, blood in  urine. Musculoskeletal: Denies joint stiffness, pain, swelling, muscle weakness. Skin: Denies rash, itching, mass, tumors, sores, and boils Neurologic: Denies headache, fainting, dizziness, seizures, numbness, and tingling. Psychiatric: Denies depression, anxiety, difficulty sleeping, and memory loss. Endocrine: Denies heat or cold intolerance, and increased thirst or urination. Blood/lymph: Denies easy bruising, easy bruising, and swollen glands     Objective:     BP 114/72   Pulse 65   Temp 98.1 F (36.7 C) (Oral)   Resp (!) 23   Ht 6\' 2"  (1.88 m)   Wt 63.5 kg   SpO2 96%   BMI 17.97 kg/m   Constitutional :  alert, cooperative, appears stated age and no distress  Lymphatics/Throat:  no asymmetry, masses, or scars  Respiratory:  clear to auscultation bilaterally  Cardiovascular:  regular rate and rhythm  Gastrointestinal: soft, non-tender; bowel sounds normal; no masses,  no organomegaly.  Musculoskeletal: Steady gait and movement  Skin: Cool and moist  Psychiatric: Normal affect, non-agitated, not confused       LABS:  CMP Latest Ref Rng & Units 05/08/2019 02/08/2019 10/31/2014  Glucose 70 - 99 mg/dL 90 102(H) 119(H)  BUN 6 - 20 mg/dL 12 17 7   Creatinine 0.61 - 1.24 mg/dL 0.98 1.06 0.96  Sodium 135 - 145 mmol/L 140 138 141  Potassium 3.5 - 5.1 mmol/L 3.6 4.1 3.8  Chloride 98 - 111 mmol/L 105 103 101  CO2 22 - 32 mmol/L 26 26 31   Calcium 8.9 - 10.3 mg/dL 9.7 9.5 9.7  Total Protein 6.5 - 8.1 g/dL - - 7.6  Total Bilirubin 0.3 - 1.2 mg/dL - - 1.1  Alkaline Phos 38 - 126 U/L - - 65  AST 15 - 41 U/L - - 23  ALT 17 -  63 U/L - - 17   CBC Latest Ref Rng & Units 05/08/2019 02/08/2019 10/31/2014  WBC 4.0 - 10.5 K/uL 8.7 8.4 14.4(H)  Hemoglobin 13.0 - 17.0 g/dL 41.9 62.2 29.7  Hematocrit 39.0 - 52.0 % 45.7 45.0 51.3  Platelets 150 - 400 K/uL 162 169 177    RADS: CLINICAL DATA:  Chest tube placement, left-sided pneumothorax  EXAM: PORTABLE CHEST 1 VIEW  COMPARISON:   05/08/2019 at 4:20 p.m.  FINDINGS: Single frontal view of the chest demonstrates interval placement of a left-sided chest tube, tip coiled over the left apex. Significant reduction in left-sided pneumothorax volume estimated less than 5% remaining. No airspace disease or effusion. The cardiac silhouette is stable.  IMPRESSION: 1. Near complete resolution of left pneumothorax after left chest tube placement. Less than 5% residual pneumothorax.   Electronically Signed   By: Sharlet Salina M.D.   On: 05/08/2019 19:39  Assessment:      Spontaneous pneumothorax, likely small bleb secondary to smoking history  Plan:      Continue chest tube to active suction and monitor on the floor with continuous pulse ox and cardiac monitoring.  Okay to resume regular diet.  Lovenox starting in the a.m.  Dr. Thelma Barge will be taking over as primary attending in the morning.

## 2019-05-08 NOTE — ED Notes (Signed)
Chest tube placement procedure started with this RN and Dr Erma Heritage. Tube to be inserted on the left side of the chest. Wife at bedside.

## 2019-05-08 NOTE — ED Triage Notes (Signed)
First RN Note: Pt presents to ED via POV with c/o palpitations x 3 days. Pt also c/o SOB with bending over. Pt states fluttering and SOB worse when he bends over.

## 2019-05-08 NOTE — ED Notes (Signed)
Pt eating dinner at this time. Pt denies pain.

## 2019-05-08 NOTE — ED Notes (Signed)
Pt to the er for left side upper chest pain that is sharp and is worse with bending over. Pt reports SOB, and pain with deep breath. Pt denies any trauma. Pt states he quit smoking 6 weeks ago and started vaping. Pt has no other health problems. Sats are good.

## 2019-05-09 ENCOUNTER — Inpatient Hospital Stay: Payer: Self-pay

## 2019-05-09 DIAGNOSIS — J9383 Other pneumothorax: Principal | ICD-10-CM

## 2019-05-09 LAB — BASIC METABOLIC PANEL
Anion gap: 7 (ref 5–15)
BUN: 12 mg/dL (ref 6–20)
CO2: 28 mmol/L (ref 22–32)
Calcium: 9.1 mg/dL (ref 8.9–10.3)
Chloride: 107 mmol/L (ref 98–111)
Creatinine, Ser: 0.85 mg/dL (ref 0.61–1.24)
GFR calc Af Amer: >60 mL/min (ref >60)
GFR calc non Af Amer: >60 mL/min (ref >60)
Glucose, Bld: 87 mg/dL (ref 70–99)
Potassium: 3.8 mmol/L (ref 3.5–5.1)
Sodium: 142 mmol/L (ref 135–145)

## 2019-05-09 LAB — CBC
HCT: 43.7 % (ref 39.0–52.0)
Hemoglobin: 15.1 g/dL (ref 13.0–17.0)
MCH: 28.2 pg (ref 26.0–34.0)
MCHC: 34.6 g/dL (ref 30.0–36.0)
MCV: 81.5 fL (ref 80.0–100.0)
Platelets: 157 10*3/uL (ref 150–400)
RBC: 5.36 MIL/uL (ref 4.22–5.81)
RDW: 13.1 % (ref 11.5–15.5)
WBC: 9.2 10*3/uL (ref 4.0–10.5)
nRBC: 0 % (ref 0.0–0.2)

## 2019-05-09 LAB — HIV ANTIBODY (ROUTINE TESTING W REFLEX): HIV Screen 4th Generation wRfx: NONREACTIVE

## 2019-05-09 NOTE — TOC Initial Note (Signed)
Transition of Care [facility]) - Initial/Assessment Note    Patient Details  Name: Jesse Graves MRN: [id number] Date of Birth: 1995-12-25  Transition of Care [facility]) CM/SW Contact:    Candie Chroman, LCSW Phone Number: 05/09/2019, 9:33 AM  Clinical Narrative:  CSW met with patient. No supports at bedside. CSW introduced role. Patient confirmed he does not have insurance or a PCP. Provided booklet for free/low-cost healthcare in [facility] and intake paperwork for [facility]. No further concerns. CSW encouraged patient to contact CSW as needed. CSW will continue to follow patient for support and facilitate return home when stable. Will continue to follow for possibility of needing [facility] Pharmacy to fill new prescriptions at discharge vs. using GoodRx coupon.                Expected Discharge Plan: Home/Self Care Barriers to Discharge: Continued Medical Work up   Patient Goals and CMS Choice        Expected Discharge Plan and Services Expected Discharge Plan: Home/Self Care       Living arrangements for the past 2 months: Single Family Home                                      Prior Living Arrangements/Services Living arrangements for the past 2 months: Single Family Home   Patient language and need for interpreter reviewed:: Yes Do you feel safe going back to the place where you live?: Yes      Need for Family Participation in Patient Care: Yes (Comment) Care giver support system in place?: Yes (comment)   Criminal Activity/Legal Involvement Pertinent to Current Situation/Hospitalization: No - Comment as needed  Activities of Daily Living Home Assistive Devices/Equipment: None ADL Screening (condition at time of admission) Patient's cognitive ability adequate to safely complete daily activities?: Yes Is the patient deaf or have difficulty hearing?: No Does the patient have difficulty seeing, even when wearing  glasses/contacts?: No Does the patient have difficulty concentrating, remembering, or making decisions?: No Patient able to express need for assistance with ADLs?: Yes Does the patient have difficulty dressing or bathing?: No Independently performs ADLs?: Yes (appropriate for developmental age) Does the patient have difficulty walking or climbing stairs?: No Weakness of Legs: None Weakness of Arms/Hands: None  Permission Sought/Granted                  Emotional Assessment Appearance:: Appears stated age Attitude/Demeanor/Rapport: Engaged, Gracious Affect (typically observed): Accepting, Appropriate, Calm, Pleasant Orientation: : Oriented to Self, Oriented to Place, Oriented to  Time, Oriented to Situation Alcohol / Substance Use: Other (comment)(Vaping) Psych Involvement: No (comment)  Admission diagnosis:  Pneumothorax [J93.9] Patient Active Problem List   Diagnosis Date Noted  . Pneumothorax 05/08/2019   PCP:  Patient, No Pcp Per Pharmacy:   [id number] [place] [address]Phone: [phone]: [phone]    Social Determinants of Health (SDOH) Interventions    Readmission Risk Interventions No flowsheet data found.

## 2019-05-09 NOTE — Progress Notes (Signed)
[facility] SURGICAL PROGRESS NOTE (cpt [phone])  Hospital Day(s): 1.   Interval History: Patient seen and examined, no acute events or new complaints overnight. Patient reports he is feeling better this morning. SOB has resolved. He does have some soreness at chest tube site. No fever or chills. Minimal drainage from chest tube and no air leak. He does note that he had been smoking cigarettes and marijuana but quit a month ago and started vaping. No history of similar in the past. No family history of spontaneous pneumothorax either.   Review of Systems:  Constitutional: denies fever, chills  HEENT: denies cough or congestion  Respiratory: denies any shortness of breath  Cardiovascular: denies chest pain or palpitations  Gastrointestinal: denies abdominal pain, N/V, or diarrhea/and bowel function as per interval history Genitourinary: denies burning with urination or urinary frequency Musculoskeletal: + Pain at chest tube site  Vital signs in last 24 hours: [min-max] current  Temp:  [97.9 F (36.6 C)-98.1 F (36.7 C)] 97.9 F (36.6 C) (04/01 0519) Pulse Rate:  [55-94] 55 (04/01 0519) Resp:  [4-23] 18 (04/01 0519) BP: (104-144)/(58-91) 104/58 (04/01 0519) SpO2:  [95 %-100 %] 99 % (04/01 0519) Weight:  [63.5 kg] 63.5 kg (03/31 1612)     Height: 6\' 2"  (188 cm) Weight: 63.5 kg BMI (Calculated): 17.97   Intake/Output last 2 shifts:  03/31 0701 - 04/01 0700 In: -  Out: 600 [Urine:100; Emesis/NG output:500]   Physical Exam:  Constitutional: tall, thin appearing male, alert, cooperative and no distress  HENT: normocephalic without obvious abnormality, poor dentition Eyes: PERRL, EOM's grossly intact and symmetric  Respiratory: breathing non-labored at rest, good breath sounds throughout all fields Chest: Chest tube in left lateral chest wall, no drainage in pleurovac, no air leak  Cardiovascular: regular rate and sinus rhythm  Musculoskeletal: no edema or wounds, motor  and sensation grossly intact, NT    Labs:  CBC Latest Ref Rng & Units 05/09/2019 05/08/2019 02/08/2019  WBC 4.0 - 10.5 K/uL 9.2 8.7 8.4  Hemoglobin 13.0 - 17.0 g/dL 04/08/2019 86.7 61.9  Hematocrit 39.0 - 52.0 % 43.7 45.7 45.0  Platelets 150 - 400 K/uL 157 162 169   CMP Latest Ref Rng & Units 05/09/2019 05/08/2019 02/08/2019  Glucose 70 - 99 mg/dL 87 90 04/08/2019)  BUN 6 - 20 mg/dL 12 12 17   Creatinine 0.61 - 1.24 mg/dL 326(Z 1.24  Sodium 135 - 145 mmol/L 142 140 138  Potassium 3.5 - 5.1 mmol/L 3.8 3.6 4.1  Chloride 98 - 111 mmol/L 107 105 103  CO2 22 - 32 mmol/L 28 26 26   Calcium 8.9 - 10.3 mg/dL 9.1 9.7 9.5  Total Protein 6.5 - 8.1 g/dL - - -  Total Bilirubin 0.3 - 1.2 mg/dL - - -  Alkaline Phos 38 - 126 U/L - - -  AST 15 - 41 U/L - - -  ALT 17 - 63 U/L - - -     Imaging studies:   CXR (05/09/2019) personally reviewed which shows improvement in left pneumothorax with well positioned chest tube, and radiologist report reviewed:  IMPRESSION: Stable left chest tube positioning and trace apical pneumothorax.   Assessment/Plan: (ICD-10's: J70.83) 24 y.o. male with clinically improved spontaneous left pneumothorax, suspect some sort of bleb disease either congenital vs smoking history.    - Continue chest tube to suction this morning  - Morning CXR, if improved/stable will place chest tube to waterseal  - pain control prn   - pulmonary  toliet   - medical management of any comorbid conditions   All of the above findings and recommendations were discussed with the patient, and the medical team, and all of patient's questions were answered to his  expressed satisfaction.  -- Edison Simon, PA-C [facility] 05/09/2019, 8:47 AM [phone] M-F: 7am - 4pm

## 2019-05-10 ENCOUNTER — Inpatient Hospital Stay: Payer: Self-pay

## 2019-05-10 MED ORDER — HYDROCODONE-ACETAMINOPHEN 5-325 MG PO TABS: 1.0000 | ORAL_TABLET | ORAL | 0 refills | Status: DC | PRN

## 2019-05-10 NOTE — Progress Notes (Signed)
Called to patients room as patient chest tube had become detached. Tubing reattached and now does not appear to have any leaking had a total of 12cc out of chest tube this shift. Pain improved at this time. Safety checks completed and call light placed within reach.

## 2019-05-10 NOTE — TOC Transition Note (Signed)
Transition of Care [facility]) - CM/SW Discharge Note   Patient Details  Name: Jesse Graves MRN: [id number] Date of Birth: May 26, 1995  Transition of Care [facility]) CM/SW Contact:  [place] Cellar, RN Phone Number: 05/10/2019, 2:15 PM   Clinical Narrative:    Attempted to contact patient in room to update that patient could pick up medication for lowest cost at [facility] as the only medication is pain medication. No answer to room phone. RN CM left message with staff nurse to update patient.     Barriers to Discharge: Continued Medical Work up   Patient Goals and CMS Choice        Discharge Placement                       Discharge Plan and Services                                     Social Determinants of Health (SDOH) Interventions     Readmission Risk Interventions No flowsheet data found.

## 2019-05-10 NOTE — Discharge Summary (Signed)
Physician Discharge Summary  Patient ID: TREYSON AXEL MRN: [id number] DOB/AGE: 05/04/1995 23 y.o.  Admit date: 05/08/2019 Discharge date: 05/10/2019   Discharge Diagnoses:  Active Problems:   Pneumothorax   Procedures: chest tube insertion`  Hospital Course: Patient presented to ER with shortness of breath and chest pain.  Had a large left sided pneumothorax treated with small bore chest tube.  No air leak noticed during his entire hospitalization.  After 48 hours, the chest tube was removed and he was discharged to home.  He will return to see Dr. Thelma Barge in one week  Disposition: Discharge disposition: 01-Home or Self Care       Discharge Instructions    Discharge instructions   Complete by: As directed    Remove dressing in 48 hours and then may shower.  Return to my clinic in one week,  May remain out of work for one week.   Increase activity slowly   Complete by: As directed         Hulda Marin, MD

## 2019-05-10 NOTE — Progress Notes (Signed)
  Patient ID: Jesse Graves, male   DOB: 1995-09-05, 24 y.o.   MRN: [id number]  HISTORY: He feels well today.  Denies chest pain or shortness of breath.   Vitals:   05/09/19 2053 05/10/19 0414  BP: 107/74 113/71  Pulse: (!) 56 (!) 57  Resp: 18 20  Temp: 97.6 F (36.4 C) 98.3 F (36.8 C)  SpO2: 99% 99%     EXAM:    Resp: Lungs are clear bilaterally.  No respiratory distress, normal effort. Heart:  Regular without murmurs Abd:  Abdomen is soft, non distended and non tender. No masses are palpable.  There is no rebound and no guarding.  Neurological: Alert and oriented to person, place, and time. Coordination normal.  Skin: Skin is warm and dry. No rash noted. No diaphoretic. No erythema. No pallor.  Psychiatric: Normal mood and affect. Normal behavior. Judgment and thought content normal.   No air leak with vigorous cough.  ASSESSMENT: Spontaneous pneumothorax   PLAN:   Water seal this morning followed by CXRay.  If OK, remove tube.  Alerted for signs and symptoms of recurrent pneumothorax.  10 % chance of recurrence quoted.  All questions answered.  Wife at bedside.    Hulda Marin, MDPatient ID: Enrigue Catena, male   DOB: 05/23/1995, 24 y.o.   MRN: [id number]

## 2019-05-10 NOTE — Progress Notes (Signed)
Jesse Graves to be D/C'd home per MD order.  Discussed prescriptions and follow up appointments with the patient. Prescriptions given to patient, medication list explained in detail. Pt verbalized understanding.  Allergies as of 05/10/2019   No Known Allergies      Medication List     TAKE these medications    HYDROcodone-acetaminophen 5-325 MG tablet Commonly known as: NORCO/VICODIN Take 1 tablet by mouth every 4 (four) hours as needed for severe pain.        Vitals:   05/09/19 2053 05/10/19 0414  BP: 107/74 113/71  Pulse: (!) 56 (!) 57  Resp: 18 20  Temp: 97.6 F (36.4 C) 98.3 F (36.8 C)  SpO2: 99% 99%    Skin clean, dry and intact without evidence of skin break down, no evidence of skin tears noted. IV catheter discontinued intact. Site without signs and symptoms of complications. Dressing and pressure applied. Pt denies pain at this time. No complaints noted.  An After Visit Summary was printed and given to the patient. Patient escorted via WC, and D/C home via private auto.  Jesse Graves

## 2019-05-12 ENCOUNTER — Other Ambulatory Visit: Payer: Self-pay

## 2019-05-12 ENCOUNTER — Encounter: Payer: Self-pay | Admitting: Emergency Medicine

## 2019-05-12 ENCOUNTER — Emergency Department: Payer: Self-pay

## 2019-05-12 ENCOUNTER — Emergency Department
Admission: EM | Admit: 2019-05-12 | Discharge: 2019-05-12 | Disposition: A | Discharge status: Home / Self Care | Payer: Self-pay | Attending: Emergency Medicine | Admitting: Emergency Medicine

## 2019-05-12 DIAGNOSIS — F17290 Nicotine dependence, other tobacco product, uncomplicated: Secondary | ICD-10-CM | POA: Insufficient documentation

## 2019-05-12 DIAGNOSIS — R0602 Shortness of breath: Secondary | ICD-10-CM | POA: Insufficient documentation

## 2019-05-12 NOTE — Discharge Instructions (Addendum)
NO HEAVY LIFTING FOR THE NEXT WEEK  Please do your best to TAKE IT EASY for the next few days to allow time to recover  Great job staying away from vaping and cigarettes - keep it up  Come back with any worsening or recurrent symptoms.

## 2019-05-12 NOTE — ED Triage Notes (Signed)
PT to triage states was release from here 2 days ago after spontaneous pneumothorax.  States sudden onset of SHOB today that scared him so he wanted to come get checked out.

## 2019-05-12 NOTE — ED Provider Notes (Signed)
[facility] Emergency Department Provider Note  ____________________________________________   First MD Initiated Contact with Patient 05/12/19 1621     (approximate)  I have reviewed the triage vital signs and the nursing notes.   HISTORY  Chief Complaint Shortness of Breath    HPI KAIEN PEZZULLO is a 24 y.o. male  With h/o recent spontaneous PTX here with SOB. Pt had left chest tube placed 3/31, was removed 4/2 and has been doing well. He was outside playing with his son earlier today when he began to feel moderately SOB. Since then, he has had mild ongoing SOB. He was told to represent to the ED if SOB returned so he is here for evaluation. It is not severe as 3/31. No cough. No significant chest pain. His chest tube site has been healing well, no drainage, redness, or skin breakdown. No other complaints.        Past Medical History:  Diagnosis Date  . Seizures [facility])     Patient Active Problem List   Diagnosis Date Noted  . Pneumothorax 05/08/2019    Past Surgical History:  Procedure Laterality Date  . TONSILLECTOMY    . tubes in ears      Prior to Admission medications   Medication Sig Start Date End Date Taking? Authorizing Provider  HYDROcodone-acetaminophen (NORCO/VICODIN) 5-325 MG tablet Take 1 tablet by mouth every 4 (four) hours as needed for severe pain. 05/10/19   Nestor Lewandowsky, MD    Allergies Patient has no known allergies.  History reviewed. No pertinent family history.  Social History Social History   Tobacco Use  . Smoking status: Current Every Day Smoker    Types: Cigarettes  . Smokeless tobacco: Current User  Substance Use Topics  . Alcohol use: No  . Drug use: Yes    Types: Marijuana    Review of Systems  Review of Systems  Constitutional: Positive for fatigue. Negative for chills and fever.  HENT: Negative for sore throat.   Respiratory: Positive for shortness of breath.   Cardiovascular: Negative for  chest pain.  Gastrointestinal: Positive for abdominal pain and nausea.  Genitourinary: Negative for flank pain.  Musculoskeletal: Negative for neck pain.  Skin: Negative for rash and wound.  Allergic/Immunologic: Negative for immunocompromised state.  Neurological: Negative for weakness and numbness.  Hematological: Does not bruise/bleed easily.  All other systems reviewed and are negative.    ____________________________________________  PHYSICAL EXAM:      VITAL SIGNS: ED Triage Vitals  Enc Vitals Group     BP 05/12/19 1618 (!) 130/98     Pulse Rate 05/12/19 1618 82     Resp 05/12/19 1618 18     Temp 05/12/19 1618 97.8 F (36.6 C)     Temp Source 05/12/19 1618 Oral     SpO2 05/12/19 1618 100 %     Weight 05/12/19 1619 140 lb (63.5 kg)     Height 05/12/19 1619 6\' 2"  (1.88 m)     Head Circumference --      Peak Flow --      Pain Score 05/12/19 1619 0     Pain Loc --      Pain Edu? --      Excl. in [place]? --      Physical Exam Vitals and nursing note reviewed.  Constitutional:      General: He is not in acute distress.    Appearance: He is well-developed.  HENT:     Head: Normocephalic  and atraumatic.  Eyes:     Conjunctiva/sclera: Conjunctivae normal.  Cardiovascular:     Rate and Rhythm: Normal rate and regular rhythm.     Heart sounds: Normal heart sounds. No murmur. No friction rub.  Pulmonary:     Effort: Pulmonary effort is normal. No respiratory distress.     Breath sounds: Normal breath sounds. No wheezing or rales.  Abdominal:     General: There is no distension.     Palpations: Abdomen is soft.     Tenderness: There is no abdominal tenderness.  Musculoskeletal:     Cervical back: Neck supple.  Skin:    General: Skin is warm.     Capillary Refill: Capillary refill takes less than 2 seconds.  Neurological:     Mental Status: He is alert and oriented to person, place, and time.     Motor: No abnormal muscle tone.        ____________________________________________   LABS (all labs ordered are listed, but only abnormal results are displayed)  Labs Reviewed - No data to display  ____________________________________________  EKG: Normal sinus rhythm, VR 73. PR 132, QRS 96, QTc 414. No acute ST elevations or depressions. ________________________________________  RADIOLOGY All imaging, including plain films, CT scans, and ultrasounds, independently reviewed by me, and interpretations confirmed via formal radiology reads.  ED MD interpretation:   CXR: No PTX  Official radiology report(s): DG Chest 2 View  Result Date: 05/12/2019 CLINICAL DATA:  Chest pain and shortness of breath. Recent pneumothorax EXAM: CHEST - 2 VIEW COMPARISON:  May 10, 2019 FINDINGS: Chest tube has been removed. No evident pneumothorax. The lungs are clear. Heart size and pulmonary vascularity are normal. No adenopathy. There is stable mild scoliosis. IMPRESSION: No pneumothorax following chest tube removal. Lungs clear. Cardiac silhouette within normal limits. Electronically Signed   By: Bretta Bang III M.D.   On: 05/12/2019 16:41    ____________________________________________  PROCEDURES   Procedure(s) performed (including Critical Care):  Procedures  ____________________________________________  INITIAL IMPRESSION / MDM / ASSESSMENT AND PLAN / ED COURSE  As part of my medical decision making, I reviewed the following data within the electronic MEDICAL RECORD NUMBER Nursing notes reviewed and incorporated, Old chart reviewed, Notes from prior ED visits, and [place] Controlled Substance Database       *RHEN KAWECKI was evaluated in Emergency Department on 05/12/2019 for the symptoms described in the history of present illness. He was evaluated in the context of the global COVID-19 pandemic, which necessitated consideration that the patient might be at risk for infection with the SARS-CoV-2 virus that causes COVID-19.  Institutional protocols and algorithms that pertain to the evaluation of patients at risk for COVID-19 are in a state of rapid change based on information released by regulatory bodies including the CDC and federal and state organizations. These policies and algorithms were followed during the patient's care in the ED.  Some ED evaluations and interventions may be delayed as a result of limited staffing during the pandemic.*     Medical Decision Making:  24 yo M here with SOB while playing outside with his son, s/p recent PTX and chest tube placement with removal 4/2. Fortunately, he is well appearing here with clear, symmetric BS. CXR shows no PTX despite sx >2 hours, do not suspect recurrent spontaneous PTX. I suspect some of this is from over exerting himself. He has no SOB in ED, no hypoxia. Will advise him to continue rest, f/u with CTS as scheduled.  ____________________________________________  FINAL CLINICAL IMPRESSION(S) / ED DIAGNOSES  Final diagnoses:  SOB (shortness of breath)     MEDICATIONS GIVEN DURING THIS VISIT:  Medications - No data to display   ED Discharge Orders    None       Note:  This document was prepared using Dragon voice recognition software and may include unintentional dictation errors.   Shaune Pollack, MD 05/12/19 [phone]

## 2019-05-15 ENCOUNTER — Other Ambulatory Visit: Payer: Self-pay

## 2019-05-15 DIAGNOSIS — J9819 Other pulmonary collapse: Secondary | ICD-10-CM

## 2019-05-17 ENCOUNTER — Encounter: Payer: Self-pay | Admitting: Cardiothoracic Surgery

## 2019-05-17 ENCOUNTER — Ambulatory Visit
Admission: RE | Admit: 2019-05-17 | Discharge: 2019-05-17 | Disposition: A | Discharge status: Home / Self Care | Payer: Self-pay | Attending: Cardiothoracic Surgery | Admitting: Cardiothoracic Surgery

## 2019-05-17 ENCOUNTER — Ambulatory Visit (INDEPENDENT_AMBULATORY_CARE_PROVIDER_SITE_OTHER): Payer: Self-pay | Admitting: Cardiothoracic Surgery

## 2019-05-17 ENCOUNTER — Ambulatory Visit
Admission: RE | Admit: 2019-05-17 | Discharge: 2019-05-17 | Disposition: A | Discharge status: Home / Self Care | Payer: Self-pay | Source: Ambulatory Visit | Attending: Cardiothoracic Surgery | Admitting: Cardiothoracic Surgery

## 2019-05-17 ENCOUNTER — Other Ambulatory Visit: Payer: Self-pay

## 2019-05-17 VITALS — BP 131/91 | HR 78 | Temp 97.9°F | Resp 14 | Ht 74.0 in | Wt 133.4 lb

## 2019-05-17 DIAGNOSIS — J9819 Other pulmonary collapse: Secondary | ICD-10-CM

## 2019-05-17 NOTE — Progress Notes (Signed)
  Patient ID: Jesse Graves, male   DOB: [birth year], 24 y.o.   MRN: [id number]  HISTORY: He returns today in follow-up.  He presented about a week ago with a left-sided spontaneous pneumothorax.  He was managed in the hospital and then when his chest tube was removed he was discharged.  He presented back to the emergency room a couple days later with complaints of chest pain and a chest x-ray at that time was normal.  He thinks that this may be related to a panic attack.  He quit smoking and quit vaping.  He states that he feels much better.  He did have a chest x-ray today which have independently reviewed.  I see no pneumothorax on that.   Vitals:   05/17/19 0857  BP: (!) 131/91  Pulse: 78  Resp: 14  Temp: 97.9 F (36.6 C)  SpO2: 99%     EXAM:    Resp: Lungs are clear bilaterally.  No respiratory distress, normal effort. Heart:  Regular without murmurs Abd:  Abdomen is soft, non distended and non tender. No masses are palpable.  There is no rebound and no guarding.  Neurological: Alert and oriented to person, place, and time. Coordination normal.  Skin: Skin is warm and dry. No rash noted. No diaphoretic. No erythema. No pallor.  Psychiatric: Normal mood and affect. Normal behavior. Judgment and thought content normal.    ASSESSMENT: Left spontaneous pneumothorax   PLAN:   I reviewed with him the results of his x-rays.  I explained to him that there is about a 10% risk of him developing a recurrent pneumothorax.  He understands.  We did not make a return appointment for him but would be happy to see him should the need arise.    Hulda Marin, MD

## 2019-05-17 NOTE — Patient Instructions (Signed)
You may return to work on Monday with no restrictions.  If you experience any symptom recurrence report to the ER right away.  Follow-up with our office as needed.  Please call and ask to speak with a nurse if you develop questions or concerns.  Pneumothorax A pneumothorax is commonly called a collapsed lung. It is a condition in which air leaks from a lung and builds up between the thin layer of tissue that covers the lungs (visceral pleura) and the interior wall of the chest cavity (parietal pleura). The air gets trapped outside the lung, between the lung and the chest wall (pleural space). The air takes up space and prevents the lung from fully expanding. This condition sometimes occurs suddenly with no apparent cause. The buildup of air may be small or large. A small pneumothorax may go away on its own. A large pneumothorax will require treatment and hospitalization. What are the causes? This condition may be caused by:  Trauma and injury to the chest wall.  Surgery and other medical procedures.  A complication of an underlying lung problem, especially chronic obstructive pulmonary disease (COPD) or emphysema. Sometimes the cause of this condition is not known. What increases the risk? You are more likely to develop this condition if:  You have an underlying lung problem.  You smoke.  You are 52-76 years old, male, tall, and underweight.  You have a personal or family history of pneumothorax.  You have an eating disorder (anorexia nervosa). This condition can also happen quickly, even in people with no history of lung problems. What are the signs or symptoms? Sometimes a pneumothorax will have no symptoms. When symptoms are present, they can include:  Chest pain.  Shortness of breath.  Increased rate of breathing.  Bluish color to your lips or skin (cyanosis). How is this diagnosed? This condition may be diagnosed by:  A medical history and physical exam.  A chest  X-ray, chest CT scan, or ultrasound. How is this treated? Treatment depends on how severe your condition is. The goal of treatment is to remove the extra air and allow your lung to expand back to its normal size.  For a small pneumothorax: ? No treatment may be needed. ? Extra oxygen is sometimes used to make it go away more quickly.  For a large pneumothorax or a pneumothorax that is causing symptoms, a procedure is done to drain the air from your lungs. To do this, a health care provider may use: ? A needle with a syringe. This is used to suck air from a pleural space where no additional leakage is taking place. ? A chest tube. This is used to suck air where there is ongoing leakage into the pleural space. The chest tube may need to remain in place for several days until the air leak has healed.  In more severe cases, surgery may be needed to repair the damage that is causing the leak.  If you have multiple pneumothorax episodes or have an air leak that will not heal, a procedure called a pleurodesis may be done. A medicine is placed in the pleural space to irritate the tissues around the lung so that the lung will stick to the chest wall, seal any leaks, and stop any buildup of air in that space. If you have an underlying lung problem, severe symptoms, or a large pneumothorax you will usually need to stay in the hospital. Follow these instructions at home: Lifestyle  Do not use any products  that contain nicotine or tobacco, such as cigarettes and e-cigarettes. These are major risk factors in pneumothorax. If you need help quitting, ask your health care provider.  Do not lift anything that is heavier than 10 lb (4.5 kg), or the limit that your health care provider tells you, until he or she says that it is safe.  Avoid activities that take a lot of effort (strenuous) for as long as told by your health care provider.  Return to your normal activities as told by your health care provider.  Ask your health care provider what activities are safe for you.  Do not fly in an airplane or scuba dive until your health care provider says it is okay. General instructions  Take over-the-counter and prescription medicines only as told by your health care provider.  If a cough or pain makes it difficult for you to sleep at night, try sleeping in a semi-upright position in a recliner or by using 2 or 3 pillows.  If you had a chest tube and it was removed, ask your health care provider when you can remove the bandage (dressing). While the dressing is in place, do not allow it to get wet.  Keep all follow-up visits as told by your health care provider. This is important. Contact a health care provider if:  You cough up thick mucus (sputum) that is yellow or green in color.  You were treated with a chest tube, and you have redness, increasing pain, or discharge at the site where it was placed. Get help right away if:  You have increasing chest pain or shortness of breath.  You have a cough that will not go away.  You begin coughing up blood.  You have pain that is getting worse or is not controlled with medicines.  The site where your chest tube was located opens up.  You feel air coming out of the site where the chest tube was placed.  You have a fever or persistent symptoms for more than 2-3 days.  You have a fever and your symptoms suddenly get worse. These symptoms may represent a serious problem that is an emergency. Do not wait to see if the symptoms will go away. Get medical help right away. Call your local emergency services (911 in the [place] Do not drive yourself to the hospital. Summary  A pneumothorax, commonly called a collapsed lung, is a condition in which air leaks from a lung and gets trapped between the lung and the chest wall (pleural space).  The buildup of air may be small or large. A small pneumothorax may go away on its own. A large pneumothorax will  require treatment and hospitalization.  Treatment for this condition depends on how severe the pneumothorax is. The goal of treatment is to remove the extra air and allow the lung to expand back to its normal size. This information is not intended to replace advice given to you by your health care provider. Make sure you discuss any questions you have with your health care provider. Document Revised: 01/06/2017 Document Reviewed: 01/02/2017 Elsevier Patient Education  2020 [employer].

## 2019-10-09 ENCOUNTER — Emergency Department (HOSPITAL_COMMUNITY): Payer: Self-pay

## 2019-10-09 ENCOUNTER — Other Ambulatory Visit: Payer: Self-pay

## 2019-10-09 ENCOUNTER — Emergency Department (HOSPITAL_COMMUNITY)
Admission: EM | Admit: 2019-10-09 | Discharge: 2019-10-09 | Disposition: A | Discharge status: Home / Self Care | Payer: Self-pay | Attending: Emergency Medicine | Admitting: Emergency Medicine

## 2019-10-09 ENCOUNTER — Encounter (HOSPITAL_COMMUNITY): Payer: Self-pay | Admitting: Emergency Medicine

## 2019-10-09 DIAGNOSIS — R079 Chest pain, unspecified: Secondary | ICD-10-CM | POA: Insufficient documentation

## 2019-10-09 DIAGNOSIS — Z5321 Procedure and treatment not carried out due to patient leaving prior to being seen by health care provider: Secondary | ICD-10-CM | POA: Insufficient documentation

## 2019-10-09 NOTE — ED Triage Notes (Signed)
Pt states he started having chest pain this morning and is concerned due to feeling like he did when he had a collapsed lung about 5 months ago.

## 2019-10-09 NOTE — ED Notes (Signed)
Went to call pt for covid swab, pt not found. Radiology reported has been calling pt since 647 am with no answer x2.

## 2019-11-22 ENCOUNTER — Emergency Department (HOSPITAL_COMMUNITY): Payer: Self-pay

## 2019-11-22 ENCOUNTER — Other Ambulatory Visit: Payer: Self-pay

## 2019-11-22 ENCOUNTER — Emergency Department (HOSPITAL_COMMUNITY)
Admission: EM | Admit: 2019-11-22 | Discharge: 2019-11-22 | Disposition: A | Discharge status: Home / Self Care | Payer: Self-pay | Attending: Emergency Medicine | Admitting: Emergency Medicine

## 2019-11-22 ENCOUNTER — Encounter (HOSPITAL_COMMUNITY): Payer: Self-pay | Admitting: Emergency Medicine

## 2019-11-22 DIAGNOSIS — R0781 Pleurodynia: Secondary | ICD-10-CM

## 2019-11-22 DIAGNOSIS — Z87891 Personal history of nicotine dependence: Secondary | ICD-10-CM | POA: Insufficient documentation

## 2019-11-22 DIAGNOSIS — R059 Cough, unspecified: Secondary | ICD-10-CM | POA: Insufficient documentation

## 2019-11-22 DIAGNOSIS — R071 Chest pain on breathing: Secondary | ICD-10-CM | POA: Insufficient documentation

## 2019-11-22 DIAGNOSIS — R0600 Dyspnea, unspecified: Secondary | ICD-10-CM | POA: Insufficient documentation

## 2019-11-22 HISTORY — DX: Other pulmonary collapse: J98.19

## 2019-11-22 LAB — BASIC METABOLIC PANEL
Anion gap: 9 (ref 5–15)
BUN: 11 mg/dL (ref 6–20)
CO2: 27 mmol/L (ref 22–32)
Calcium: 9.6 mg/dL (ref 8.9–10.3)
Chloride: 103 mmol/L (ref 98–111)
Creatinine, Ser: 0.86 mg/dL (ref 0.61–1.24)
GFR, Estimated: >60 mL/min (ref >60)
Glucose, Bld: 99 mg/dL (ref 70–99)
Potassium: 4 mmol/L (ref 3.5–5.1)
Sodium: 139 mmol/L (ref 135–145)

## 2019-11-22 LAB — CBC WITH DIFFERENTIAL/PLATELET
Abs Immature Granulocytes: 0.02 10*3/uL (ref 0.00–0.07)
Basophils Absolute: 0 10*3/uL (ref 0.0–0.1)
Basophils Relative: 1 %
Eosinophils Absolute: 0.5 10*3/uL (ref 0.0–0.5)
Eosinophils Relative: 6 %
HCT: 48 % (ref 39.0–52.0)
Hemoglobin: 16.7 g/dL (ref 13.0–17.0)
Immature Granulocytes: 0 %
Lymphocytes Relative: 25 %
Lymphs Abs: 2 10*3/uL (ref 0.7–4.0)
MCH: 28.4 pg (ref 26.0–34.0)
MCHC: 34.8 g/dL (ref 30.0–36.0)
MCV: 81.8 fL (ref 80.0–100.0)
Monocytes Absolute: 0.7 10*3/uL (ref 0.1–1.0)
Monocytes Relative: 9 %
Neutro Abs: 4.6 10*3/uL (ref 1.7–7.7)
Neutrophils Relative %: 59 %
Platelets: 158 10*3/uL (ref 150–400)
RBC: 5.87 MIL/uL — ABNORMAL HIGH (ref 4.22–5.81)
RDW: 13 % (ref 11.5–15.5)
WBC: 7.9 10*3/uL (ref 4.0–10.5)
nRBC: 0 % (ref 0.0–0.2)

## 2019-11-22 LAB — D-DIMER, QUANTITATIVE: D-Dimer, Quant: 0.33 ug/mL-FEU (ref 0.00–0.50)

## 2019-11-22 MED ORDER — PREDNISONE 50 MG PO TABS
60.0000 mg | ORAL_TABLET | Freq: Once | ORAL | Status: AC
  Administered 2019-11-22: 60 mg via ORAL
  Filled 2019-11-22: qty 1

## 2019-11-22 MED ORDER — PREDNISONE 20 MG PO TABS: 60.0000 mg | ORAL_TABLET | Freq: Every day | ORAL | 0 refills | Status: AC

## 2019-11-22 MED ORDER — ALBUTEROL SULFATE HFA 108 (90 BASE) MCG/ACT IN AERS
2.0000 | INHALATION_SPRAY | Freq: Once | RESPIRATORY_TRACT | Status: AC
  Administered 2019-11-22: 2 via RESPIRATORY_TRACT
  Filled 2019-11-22: qty 6.7

## 2019-11-22 NOTE — ED Triage Notes (Signed)
Pt reports shortness of breath and pain with deep breath that started about 2 days ago, thought it was a pulled muscle, but has not improved. Pt says the last time this happened he had a collapsed lung on left side.

## 2019-11-22 NOTE — Discharge Instructions (Addendum)
Use the inhaler as needed.  You may take 2 puffs from it, every 4 hours.  Take ibuprofen at or naproxen for pain.  If necessary, you may also take acetaminophen.  Acetaminophen when taken with medications like ibuprofen or naproxen gives additional pain relief above and beyond what either gives by itself.

## 2019-11-22 NOTE — ED Provider Notes (Signed)
[facility] EMERGENCY DEPARTMENT Provider Note   CSN: [id number] Arrival date & time: 11/22/19  0303   History Chief Complaint  Patient presents with  . Shortness of Breath    Jesse Graves is a 24 y.o. male.  The history is provided by the patient.  Shortness of Breath He has history of seizures, spontaneous pneumothorax and comes in with 2-day history of chest pain, cough, dyspnea.  Pain is in the midsternal area and radiates to both sides.  There is also pain in the left lateral rib cage when he raises his left arm.  Pain is worse with a deep breath.  He rates pain at 9/10.  There is no associated fever, chills, sweats.  Denies any nausea, vomiting.  There has been a cough productive of small amount of yellow sputum.  He denies any change in sense of smell or taste.  He denies exposure to COVID-19, but has not been vaccinated against COVID-19.  Past Medical History:  Diagnosis Date  . Collapsed lung   . Seizures [facility])     Patient Active Problem List   Diagnosis Date Noted  . Pneumothorax 05/08/2019    Past Surgical History:  Procedure Laterality Date  . TONSILLECTOMY    . tubes in ears         Family History  Problem Relation Age of Onset  . COPD Maternal Grandfather     Social History   Tobacco Use  . Smoking status: Former Smoker    Years: 9.00    Types: Cigarettes    Quit date: 02/08/2015    Years since quitting: 4.7  . Smokeless tobacco: Former Clinical biochemist  . Vaping Use: Former  Substance Use Topics  . Alcohol use: No  . Drug use: Yes    Types: Marijuana    Home Medications Prior to Admission medications   Not on File    Allergies    Patient has no known allergies.  Review of Systems   Review of Systems  Respiratory: Positive for shortness of breath.   All other systems reviewed and are negative.   Physical Exam Updated Vital Signs BP 124/81   Pulse 71   Temp 97.9 F (36.6 C) (Oral)   Ht 6\' 2"  (1.88 m)   Wt 63.5 kg   SpO2  99%   BMI 17.97 kg/m   Physical Exam Vitals and nursing note reviewed.   24 year old male, resting comfortably and in no acute distress. Vital signs are normal. Oxygen saturation is 99%, which is normal. Head is normocephalic and atraumatic. PERRLA, EOMI. Oropharynx is clear. Neck is nontender and supple without adenopathy or JVD. Back is nontender and there is no CVA tenderness. Lungs are clear without rales, wheezes, or rhonchi. Chest is mildly tender in the left lateral rib cage without crepitus. Heart has regular rate and rhythm without murmur. Abdomen is soft, flat, nontender without masses or hepatosplenomegaly and peristalsis is normoactive. Extremities have no cyanosis or edema, full range of motion is present. Skin is warm and dry without rash. Neurologic: Mental status is normal, cranial nerves are intact, there are no motor or sensory deficits.  ED Results / Procedures / Treatments   Labs (all labs ordered are listed, but only abnormal results are displayed) Labs Reviewed  CBC WITH DIFFERENTIAL/PLATELET - Abnormal; Notable for the following components:      Result Value   RBC 5.87 (*)    All other components within normal limits  BASIC METABOLIC  PANEL  D-DIMER, QUANTITATIVE (NOT AT [facility])   Radiology DG Chest 2 View  Result Date: 11/22/2019 CLINICAL DATA:  Shortness of breath. Pain with deep breath for 2 days. Personal history of pneumothorax. EXAM: CHEST - 2 VIEW COMPARISON:  Multiple prior chest x-rays from 05/08/2019 through 05/17/2019 FINDINGS: Heart size is normal. Lungs are clear. Scarring is present at the left apex. No pneumothorax is present. No edema or effusion is present. No focal airspace disease is evident. Visualized soft tissues and bony thorax are unremarkable. IMPRESSION: 1. Acute cardiopulmonary disease.  No pneumothorax. 2. Minimal scarring at the left apex is stable. Electronically Signed   By: Marin Roberts M.D.   On: 11/22/2019 04:33    Procedures Procedures  Medications Ordered in ED Medications  predniSONE (DELTASONE) tablet 60 mg (has no administration in time range)  albuterol (VENTOLIN HFA) 108 (90 Base) MCG/ACT inhaler 2 puff (2 puffs Inhalation Given 11/22/19 0457)    ED Course  I have reviewed the triage vital signs and the nursing notes.  Pertinent labs & imaging results that were available during my care of the patient were reviewed by me and considered in my medical decision making (see chart for details).  MDM Rules/Calculators/A&P Pleuritic chest pain with dyspnea.  Consider spontaneous pneumothorax, pneumonia, bronchitis, viral pleurisy.  Differential does include conditions with significant morbidity and mortality.  Will check screening labs and chest x-ray.  Old records reviewed confirming hospitalization for spontaneous pneumothorax in March of this year.  Chest x-ray shows no evidence of pneumothorax or pneumonia.  Labs are normal including normal D-dimer.  He is given albuterol via inhaler with significant improvement in symptoms.  At this point, diagnosis is felt to be viral pleurisy.  He is given a dose of prednisone and given prescription for same.  He is told to take over-the-counter NSAIDs as needed, supplement with acetaminophen as needed.  Advised to use the inhaler as needed.  Return precautions discussed.  Final Clinical Impression(s) / ED Diagnoses Final diagnoses:  Pleuritic chest pain    Rx / DC Orders ED Discharge Orders         Ordered    predniSONE (DELTASONE) 20 MG tablet  Daily        11/22/19 0520           Dione Booze, MD 11/22/19 [phone]

## 2019-12-21 ENCOUNTER — Other Ambulatory Visit: Payer: Self-pay

## 2019-12-21 DIAGNOSIS — Z20822 Contact with and (suspected) exposure to covid-19: Secondary | ICD-10-CM

## 2019-12-22 LAB — SARS-COV-2, NAA 2 DAY TAT

## 2019-12-22 LAB — NOVEL CORONAVIRUS, NAA: SARS-CoV-2, NAA: NOT DETECTED

## 2020-04-01 ENCOUNTER — Encounter (HOSPITAL_COMMUNITY): Payer: Self-pay | Admitting: Emergency Medicine

## 2020-04-01 ENCOUNTER — Other Ambulatory Visit: Payer: Self-pay

## 2020-04-01 ENCOUNTER — Emergency Department (HOSPITAL_COMMUNITY)
Admission: EM | Admit: 2020-04-01 | Discharge: 2020-04-01 | Disposition: A | Discharge status: Home / Self Care | Payer: Self-pay | Attending: Emergency Medicine | Admitting: Emergency Medicine

## 2020-04-01 DIAGNOSIS — K0889 Other specified disorders of teeth and supporting structures: Secondary | ICD-10-CM | POA: Insufficient documentation

## 2020-04-01 DIAGNOSIS — Z87891 Personal history of nicotine dependence: Secondary | ICD-10-CM | POA: Insufficient documentation

## 2020-04-01 MED ORDER — PENICILLIN V POTASSIUM 500 MG PO TABS: 500.0000 mg | ORAL_TABLET | Freq: Four times a day (QID) | ORAL | 0 refills | Status: AC

## 2020-04-01 MED ORDER — HYDROCODONE-ACETAMINOPHEN 5-325 MG PO TABS
1.0000 | ORAL_TABLET | Freq: Four times a day (QID) | ORAL | 0 refills | Status: AC | PRN
Start: 2020-04-01 — End: ?

## 2020-04-01 MED ORDER — NAPROXEN 500 MG PO TABS: 500.0000 mg | ORAL_TABLET | Freq: Two times a day (BID) | ORAL | 0 refills | Status: AC

## 2020-04-01 NOTE — Discharge Instructions (Signed)
Take the antibiotic penicillin as directed for the next 7 days.  To help settle down the infection.  Take the Naprosyn on a regular basis for the next 7 days.  And supplement with the hydrocodone as needed for additional pain relief.  As we discussed you need to follow-up with dentist probably have tooth removed.

## 2020-04-01 NOTE — ED Triage Notes (Signed)
Pt reports left upper dental pain that started last night. Pt denies fevers.

## 2020-04-01 NOTE — ED Provider Notes (Signed)
[facility] EMERGENCY DEPARTMENT Provider Note   CSN: [id number] Arrival date & time: 04/01/20  4098     History Chief Complaint  Patient presents with  . Dental Pain    Jesse Graves is a 25 y.o. male.  Patient with 1week history of right upper molar tooth pain.  Getting worse.  Patient states the tooth cracked off sometime in the past.  Patient has no known allergies.        Past Medical History:  Diagnosis Date  . Collapsed lung   . Seizures [facility])     Patient Active Problem List   Diagnosis Date Noted  . Pneumothorax 05/08/2019    Past Surgical History:  Procedure Laterality Date  . TONSILLECTOMY    . tubes in ears         Family History  Problem Relation Age of Onset  . COPD Maternal Grandfather     Social History   Tobacco Use  . Smoking status: Former Smoker    Years: 9.00    Types: Cigarettes    Quit date: 02/08/2015    Years since quitting: 5.1  . Smokeless tobacco: Former Clinical biochemist  . Vaping Use: Former  Substance Use Topics  . Alcohol use: No  . Drug use: Yes    Types: Marijuana    Home Medications Prior to Admission medications   Medication Sig Start Date End Date Taking? Authorizing Provider  HYDROcodone-acetaminophen (NORCO/VICODIN) 5-325 MG tablet Take 1 tablet by mouth every 6 (six) hours as needed for moderate pain. 04/01/20  Yes Vanetta Mulders, MD  naproxen (NAPROSYN) 500 MG tablet Take 1 tablet (500 mg total) by mouth 2 (two) times daily. 04/01/20  Yes Vanetta Mulders, MD  penicillin v potassium (VEETID) 500 MG tablet Take 1 tablet (500 mg total) by mouth 4 (four) times daily for 7 days. 04/01/20 04/08/20 Yes Vanetta Mulders, MD  predniSONE (DELTASONE) 20 MG tablet Take 3 tablets (60 mg total) by mouth daily. 11/22/19   Dione Booze, MD    Allergies    Patient has no known allergies.  Review of Systems   Review of Systems  Constitutional: Negative for chills and fever.  HENT: Positive for dental problem. Negative  for facial swelling, rhinorrhea and sore throat.   Eyes: Negative for visual disturbance.  Respiratory: Negative for cough and shortness of breath.   Cardiovascular: Negative for chest pain and leg swelling.  Gastrointestinal: Negative for abdominal pain, diarrhea, nausea and vomiting.  Genitourinary: Negative for dysuria.  Musculoskeletal: Negative for back pain and neck pain.  Skin: Negative for rash.  Neurological: Negative for dizziness, light-headedness and headaches.  Hematological: Does not bruise/bleed easily.  Psychiatric/Behavioral: Negative for confusion.    Physical Exam Updated Vital Signs BP 133/81 (BP Location: Left Arm)   Pulse 64   Temp 98.8 F (37.1 C) (Oral)   Resp 18   Ht 1.905 m (6\' 3" )   Wt 63.5 kg   SpO2 98%   BMI 17.50 kg/m   Physical Exam Vitals and nursing note reviewed.  Constitutional:      Appearance: Normal appearance. He is well-developed and well-nourished.  HENT:     Head: Normocephalic and atraumatic.     Comments: No significant swelling to face jaw or upper neck.    Mouth/Throat:     Mouth: Mucous membranes are moist.     Pharynx: Oropharynx is clear.     Comments: No significant swelling or purulent discharge.  Tenderness to palpation to the  right last upper molar. Eyes:     Extraocular Movements: Extraocular movements intact.     Conjunctiva/sclera: Conjunctivae normal.     Pupils: Pupils are equal, round, and reactive to light.  Cardiovascular:     Rate and Rhythm: Normal rate and regular rhythm.     Heart sounds: No murmur heard.   Pulmonary:     Effort: Pulmonary effort is normal. No respiratory distress.     Breath sounds: Normal breath sounds.  Abdominal:     Palpations: Abdomen is soft.     Tenderness: There is no abdominal tenderness.  Musculoskeletal:        General: No edema. Normal range of motion.     Cervical back: Normal range of motion and neck supple.  Skin:    General: Skin is warm and dry.     Capillary  Refill: Capillary refill takes less than 2 seconds.  Neurological:     General: No focal deficit present.     Mental Status: He is alert and oriented to person, place, and time.     Cranial Nerves: No cranial nerve deficit.     Sensory: No sensory deficit.  Psychiatric:        Mood and Affect: Mood and affect normal.     ED Results / Procedures / Treatments   Labs (all labs ordered are listed, but only abnormal results are displayed) Labs Reviewed - No data to display  EKG None  Radiology No results found.  Procedures Procedures   Medications Ordered in ED Medications - No data to display  ED Course  I have reviewed the triage vital signs and the nursing notes.  Pertinent labs & imaging results that were available during my care of the patient were reviewed by me and considered in my medical decision making (see chart for details).    MDM Rules/Calculators/A&P                          Patient nontoxic no acute distress.  Acute right upper molar pain.  Possible abscess.  Will treat with penicillin Naprosyn and hydrocodone as needed for worse pain.  Patient instructed to follow-up with dentist.     Final Clinical Impression(s) / ED Diagnoses Final diagnoses:  Pain, dental    Rx / DC Orders ED Discharge Orders         Ordered    naproxen (NAPROSYN) 500 MG tablet  2 times daily        04/01/20 0802    HYDROcodone-acetaminophen (NORCO/VICODIN) 5-325 MG tablet  Every 6 hours PRN        04/01/20 0802    penicillin v potassium (VEETID) 500 MG tablet  4 times daily        04/01/20 0802           Vanetta Mulders, MD 04/01/20 [phone]

## 2020-09-27 ENCOUNTER — Emergency Department (HOSPITAL_COMMUNITY)
Admission: EM | Admit: 2020-09-27 | Discharge: 2020-09-27 | Disposition: A | Discharge status: Home / Self Care | Payer: Self-pay | Attending: Emergency Medicine | Admitting: Emergency Medicine

## 2020-09-27 ENCOUNTER — Other Ambulatory Visit: Payer: Self-pay

## 2020-09-27 ENCOUNTER — Encounter (HOSPITAL_COMMUNITY): Payer: Self-pay | Admitting: Emergency Medicine

## 2020-09-27 DIAGNOSIS — T148XXA Other injury of unspecified body region, initial encounter: Secondary | ICD-10-CM

## 2020-09-27 DIAGNOSIS — S161XXA Strain of muscle, fascia and tendon at neck level, initial encounter: Secondary | ICD-10-CM | POA: Insufficient documentation

## 2020-09-27 DIAGNOSIS — X58XXXA Exposure to other specified factors, initial encounter: Secondary | ICD-10-CM | POA: Insufficient documentation

## 2020-09-27 DIAGNOSIS — Z87891 Personal history of nicotine dependence: Secondary | ICD-10-CM | POA: Insufficient documentation

## 2020-09-27 MED ORDER — CYCLOBENZAPRINE HCL 10 MG PO TABS: 10.0000 mg | ORAL_TABLET | Freq: Two times a day (BID) | ORAL | 0 refills | Status: AC | PRN

## 2020-09-27 NOTE — ED Triage Notes (Signed)
Pt woke up today with a stiff neck and lacks ROM.

## 2020-09-27 NOTE — Discharge Instructions (Signed)
You have been seen here for neck pain, I recommend taking over-the-counter pain medications like ibuprofen and/or Tylenol every 6 as needed.  Please follow dosage and on the back of bottle.  I also recommend applying heat to the area and stretching out the muscles as this will help decrease stiffness and pain.  I have given you information on exercises please follow.  I have also given you a prescription for a muscle relaxer this can make you drowsy do not consume alcohol or operate heavy machinery when taking this medication.   Please follow-up with PCP as needed.  Come back to the emergency department if you develop chest pain, shortness of breath, severe abdominal pain, uncontrolled nausea, vomiting, diarrhea.

## 2020-09-27 NOTE — ED Provider Notes (Signed)
[facility] EMERGENCY DEPARTMENT Provider Note   CSN: [id number] Arrival date & time: 09/27/20  1452     History Chief Complaint  Patient presents with   Torticollis    Jesse Graves is a 25 y.o. male.  HPI  Patient with significant medical history of seizures presents to the emergency department with chief complaint of neck pain.  Patient states that this started today,  immediately after  lifting up a 50 pound fish tank.  He states he has pain between his shoulder blades worse on the right versus the left, pain will radiate on the sides of his neck, he has pain when he turns his neck left or right as well as moving his shoulders, he denies  paresthesias or weakness in his arms, he denies headaches, change in vision, deniesrecent trauma to the area.  He denies illicit drug use, denies associated fevers or chills.  He thinks that he might of strained his neck.  He has no chest pain or shortness of breath, denies pedal edema.  He has not taking any medication for his pain.  Past Medical History:  Diagnosis Date   Collapsed lung    Seizures (HCC)     Patient Active Problem List   Diagnosis Date Noted   Pneumothorax 05/08/2019    Past Surgical History:  Procedure Laterality Date   TONSILLECTOMY     tubes in ears         Family History  Problem Relation Age of Onset   COPD Maternal Grandfather     Social History   Tobacco Use   Smoking status: Former    Years: 9.00    Types: Cigarettes    Quit date: 02/08/2015    Years since quitting: 5.6   Smokeless tobacco: Former  Building services engineer Use: Former  Substance Use Topics   Alcohol use: No   Drug use: Yes    Types: Marijuana    Home Medications Prior to Admission medications   Medication Sig Start Date End Date Taking? Authorizing Provider  cyclobenzaprine (FLEXERIL) 10 MG tablet Take 1 tablet (10 mg total) by mouth 2 (two) times daily as needed for muscle spasms. 09/27/20  Yes Carroll Sage, PA-C   HYDROcodone-acetaminophen (NORCO/VICODIN) 5-325 MG tablet Take 1 tablet by mouth every 6 (six) hours as needed for moderate pain. 04/01/20   Vanetta Mulders, MD  naproxen (NAPROSYN) 500 MG tablet Take 1 tablet (500 mg total) by mouth 2 (two) times daily. 04/01/20   Vanetta Mulders, MD  predniSONE (DELTASONE) 20 MG tablet Take 3 tablets (60 mg total) by mouth daily. 11/22/19   Dione Booze, MD    Allergies    Patient has no known allergies.  Review of Systems   Review of Systems  Constitutional:  Negative for chills and fever.  HENT:  Negative for congestion.   Respiratory:  Negative for shortness of breath.   Cardiovascular:  Negative for chest pain.  Gastrointestinal:  Negative for abdominal pain.  Genitourinary:  Negative for enuresis.  Musculoskeletal:  Positive for back pain.       Scapula pain  Skin:  Negative for rash.  Neurological:  Negative for dizziness, weakness, numbness and headaches.  Hematological:  Does not bruise/bleed easily.   Physical Exam Updated Vital Signs BP 122/70   Pulse 72   Temp 98.3 F (36.8 C) (Oral)   Resp 17   Ht 6\' 3"  (1.905 m)   Wt 63.5 kg   SpO2 99%  BMI 17.50 kg/m   Physical Exam Vitals and nursing note reviewed.  Constitutional:      General: He is not in acute distress.    Appearance: He is not ill-appearing.  HENT:     Head: Normocephalic and atraumatic.     Nose: No congestion.  Eyes:     Extraocular Movements: Extraocular movements intact.     Conjunctiva/sclera: Conjunctivae normal.     Pupils: Pupils are equal, round, and reactive to light.  Neck:     Comments: Spine was palpated was nontender to palpation, patient does have pain along the lateral muscles of his neck.  He still has full range of motion in his neck but rotation of the neck does elicit pain. Cardiovascular:     Rate and Rhythm: Normal rate and regular rhythm.     Pulses: Normal pulses.     Heart sounds: No murmur heard.   No friction rub. No gallop.   Pulmonary:     Effort: No respiratory distress.     Breath sounds: No wheezing, rhonchi or rales.  Musculoskeletal:     Comments: Spine was palpated was nontender to palpation, does have no tenderness along the musculature between the medial border of the scapula and thoracic spine bilaterally worse in the right versus left.  He has full range of motion in his upper extremities, neurovascular fully intact.  Skin:    General: Skin is warm and dry.  Neurological:     Mental Status: He is alert.  Psychiatric:        Mood and Affect: Mood normal.    ED Results / Procedures / Treatments   Labs (all labs ordered are listed, but only abnormal results are displayed) Labs Reviewed - No data to display  EKG None  Radiology No results found.  Procedures Procedures   Medications Ordered in ED Medications - No data to display  ED Course  I have reviewed the triage vital signs and the nursing notes.  Pertinent labs & imaging results that were available during my care of the patient were reviewed by me and considered in my medical decision making (see chart for details).    MDM Rules/Calculators/A&P                          Initial impression-patient presents with neck pain.  He is alert, does not appear acute stress, vital signs reassuring.  Work-up-due to well-appearing patient, benign for exam further lab or imaging are not warranted at this time.  Rule out-I have low suspicion for  spinal fracture or spinal cord abnormality as there is no gross deformities present on exam, no step-off, spine is nontender to palpation, patient is full range of motion 5 5 strength the upper extremities, neurovascular fully intact.  Due to no traumatic injury will defer imaging at this time.  I have low suspicion for septic arthritis as patient denies illicit drug use, there is no erythematous joints on my exam, no new heart murmur, presentation is atypical of etiology pain happened directly after  lifting a fish tank.  More consistent with muscular strain.  Low suspicion for vertebral dissection as presentation does not fit etiology.    Plan-  Neck and scapular pain-likely muscular in nature, will provide patient with a muscle relaxer, recommend over-the-counter pain medications, follow-up PCP as needed.  Vital signs have remained stable, no indication for hospital admission.  Patient given at home care as well strict return precautions.  Patient verbalized that they understood agreed to said plan.  Final Clinical Impression(s) / ED Diagnoses Final diagnoses:  Muscle strain    Rx / DC Orders ED Discharge Orders          Ordered    cyclobenzaprine (FLEXERIL) 10 MG tablet  2 times daily PRN        09/27/20 1548             Barnie Del 09/27/20 1550    Pricilla Loveless, MD 10/01/20 [phone]

## 2021-03-14 ENCOUNTER — Other Ambulatory Visit: Payer: Self-pay

## 2021-03-14 ENCOUNTER — Emergency Department (HOSPITAL_COMMUNITY)
Admission: EM | Admit: 2021-03-14 | Discharge: 2021-03-14 | Disposition: A | Discharge status: Home / Self Care | Payer: Self-pay | Attending: Emergency Medicine | Admitting: Emergency Medicine

## 2021-03-14 ENCOUNTER — Encounter (HOSPITAL_COMMUNITY): Payer: Self-pay | Admitting: *Deleted

## 2021-03-14 DIAGNOSIS — L089 Local infection of the skin and subcutaneous tissue, unspecified: Secondary | ICD-10-CM | POA: Insufficient documentation

## 2021-03-14 DIAGNOSIS — Z23 Encounter for immunization: Secondary | ICD-10-CM | POA: Insufficient documentation

## 2021-03-14 DIAGNOSIS — Z4800 Encounter for change or removal of nonsurgical wound dressing: Secondary | ICD-10-CM | POA: Insufficient documentation

## 2021-03-14 MED ORDER — CEPHALEXIN 500 MG PO CAPS: 500.0000 mg | ORAL_CAPSULE | Freq: Four times a day (QID) | ORAL | 0 refills | Status: AC

## 2021-03-14 MED ORDER — TETANUS-DIPHTH-ACELL PERTUSSIS 5-2.5-18.5 LF-MCG/0.5 IM SUSY
0.5000 mL | PREFILLED_SYRINGE | Freq: Once | INTRAMUSCULAR | Status: AC
  Administered 2021-03-14: 0.5 mL via INTRAMUSCULAR
  Filled 2021-03-14: qty 0.5

## 2021-03-14 MED ORDER — CEPHALEXIN 500 MG PO CAPS
500.0000 mg | ORAL_CAPSULE | Freq: Once | ORAL | Status: AC
  Administered 2021-03-14: 500 mg via ORAL
  Filled 2021-03-14: qty 1

## 2021-03-14 NOTE — ED Provider Notes (Signed)
[facility] EMERGENCY DEPARTMENT Provider Note   CSN: [id number] Arrival date & time: 03/14/21  1053     History  Chief Complaint  Patient presents with   Wound Check    Jesse Graves is a 26 y.o. male.   Wound Check   Patient presents with wound to right second finger.  Noticed it 2 weeks ago when he was at work, patient works at [employer].  States it is painful when he flexes it, not when he extends it.  The wound is on the dorsal aspect of the finger, there was purulent drainage when squeezed a few days ago.  States there is redness that is spreading.  He is not having any fevers at home, is able to flex and extend the digit.  Denies any history of diabetes or immunocompromise state.  Has not been on any recent antibiotics.  Home Medications Prior to Admission medications   Medication Sig Start Date End Date Taking? Authorizing Provider  cephALEXin (KEFLEX) 500 MG capsule Take 1 capsule (500 mg total) by mouth 4 (four) times daily. 03/14/21  Yes Theron Arista, PA-C  cyclobenzaprine (FLEXERIL) 10 MG tablet Take 1 tablet (10 mg total) by mouth 2 (two) times daily as needed for muscle spasms. 09/27/20   Carroll Robie Mcniel, PA-C  HYDROcodone-acetaminophen (NORCO/VICODIN) 5-325 MG tablet Take 1 tablet by mouth every 6 (six) hours as needed for moderate pain. 04/01/20   Vanetta Mulders, MD  naproxen (NAPROSYN) 500 MG tablet Take 1 tablet (500 mg total) by mouth 2 (two) times daily. 04/01/20   Vanetta Mulders, MD  predniSONE (DELTASONE) 20 MG tablet Take 3 tablets (60 mg total) by mouth daily. 11/22/19   Dione Booze, MD      Allergies    Patient has no known allergies.    Review of Systems   Review of Systems  Skin:  Positive for wound.   Physical Exam Updated Vital Signs BP 125/83 (BP Location: Right Arm)    Pulse 89    Temp 98 F (36.7 C) (Oral)    Resp 17    Ht 6\' 3"  (1.905 m)    Wt 64 kg    SpO2 100%    BMI 17.64 kg/m  Physical Exam Vitals and nursing note reviewed.  Exam conducted with a chaperone present.  Constitutional:      General: He is not in acute distress.    Appearance: Normal appearance.  HENT:     Head: Normocephalic and atraumatic.  Eyes:     General: No scleral icterus.    Extraocular Movements: Extraocular movements intact.     Pupils: Pupils are equal, round, and reactive to light.  Musculoskeletal:        General: Tenderness present. Normal range of motion.     Comments: Able to fully flex and extend at the DIP and PIP.  Skin:    General: Skin is warm.     Capillary Refill: Capillary refill takes less than 2 seconds.     Coloration: Skin is not jaundiced.     Findings: Erythema and rash present.  Neurological:     Mental Status: He is alert. Mental status is at baseline.     Coordination: Coordination normal.     ED Results / Procedures / Treatments   Labs (all labs ordered are listed, but only abnormal results are displayed) Labs Reviewed - No data to display  EKG None  Radiology No results found.  Procedures Procedures    Medications Ordered  in ED Medications  Tdap (BOOSTRIX) injection 0.5 mL (0.5 mLs Intramuscular Given 03/14/21 1111)  cephALEXin (KEFLEX) capsule 500 mg (500 mg Oral Given 03/14/21 1120)    ED Course/ Medical Decision Making/ A&P                           Medical Decision Making Risk Prescription drug management.   This patient presents to the ED for concern of finger pain, this involves an extensive number of treatment options, and is a complaint that carries with it a high risk of complications and morbidity.  The differential diagnosis includes cellulitis, abscess, muscle injury, strain, tenosynovitis, other   Co morbidities that complicate the patient evaluation: none    Additional history obtained: -External records from outside source obtained and reviewed including: Chart review including previous notes, labs, imaging, consultation notes  Medicines ordered and prescription drug  management: -I ordered medication including tetanus update and Keflex for infection -Reevaluation of the patient after these medicines showed that the patient stayed the same -I have reviewed the patients home medicines and have made adjustments as needed   ED Course: Patient is a 26 year old male presenting due to pain.  Relating erythema consistent with a cellulitis, no purulent drainage amenable to I&D.  Good cap refill, good range of motion.  Low suspicion for flexor tenosynovitis, purulent abscess.  Doubt there is any underlying osteomyelitis given the acuity.  Additionally no trauma.  Do not think patient needs admission, I also do not think imaging would be particularly beneficial at this time.  We will proceed with Keflex will boost his tetanus given his been greater than 10 years since his last update.  Patient in agreement, discharged in stable condition.   Disposition: D/C         Final Clinical Impression(s) / ED Diagnoses Final diagnoses:  Wound infection    Rx / DC Orders ED Discharge Orders          Ordered    cephALEXin (KEFLEX) 500 MG capsule  4 times daily        03/14/21 1125              Theron Arista, PA-C 03/14/21 1621    Gloris Manchester, MD 03/16/21 1425

## 2021-03-14 NOTE — Discharge Instructions (Signed)
Use the good Rx coupon.  You can print it or pull it up on your phone.  It appears that [facility] is the cheapest option.  Take Keflex 4 times daily for the next 5 days.  Your first dose was given here in the ED.

## 2021-03-14 NOTE — ED Triage Notes (Signed)
Wound to right ring finger x 2 weeks, states it may be a spider bite

## 2021-11-14 IMAGING — CR DG CHEST 2V
1 series · 2 of 2 positions shown · non-contrast
Comparison: None.

CLINICAL DATA: Patient c/o right sided chest pain X2 days.

EXAM:
CHEST - 2 VIEW

[Series 1: dg chest 2 view · 0.14mm/px · 2 of 2 slices shown]
[im 1/2]
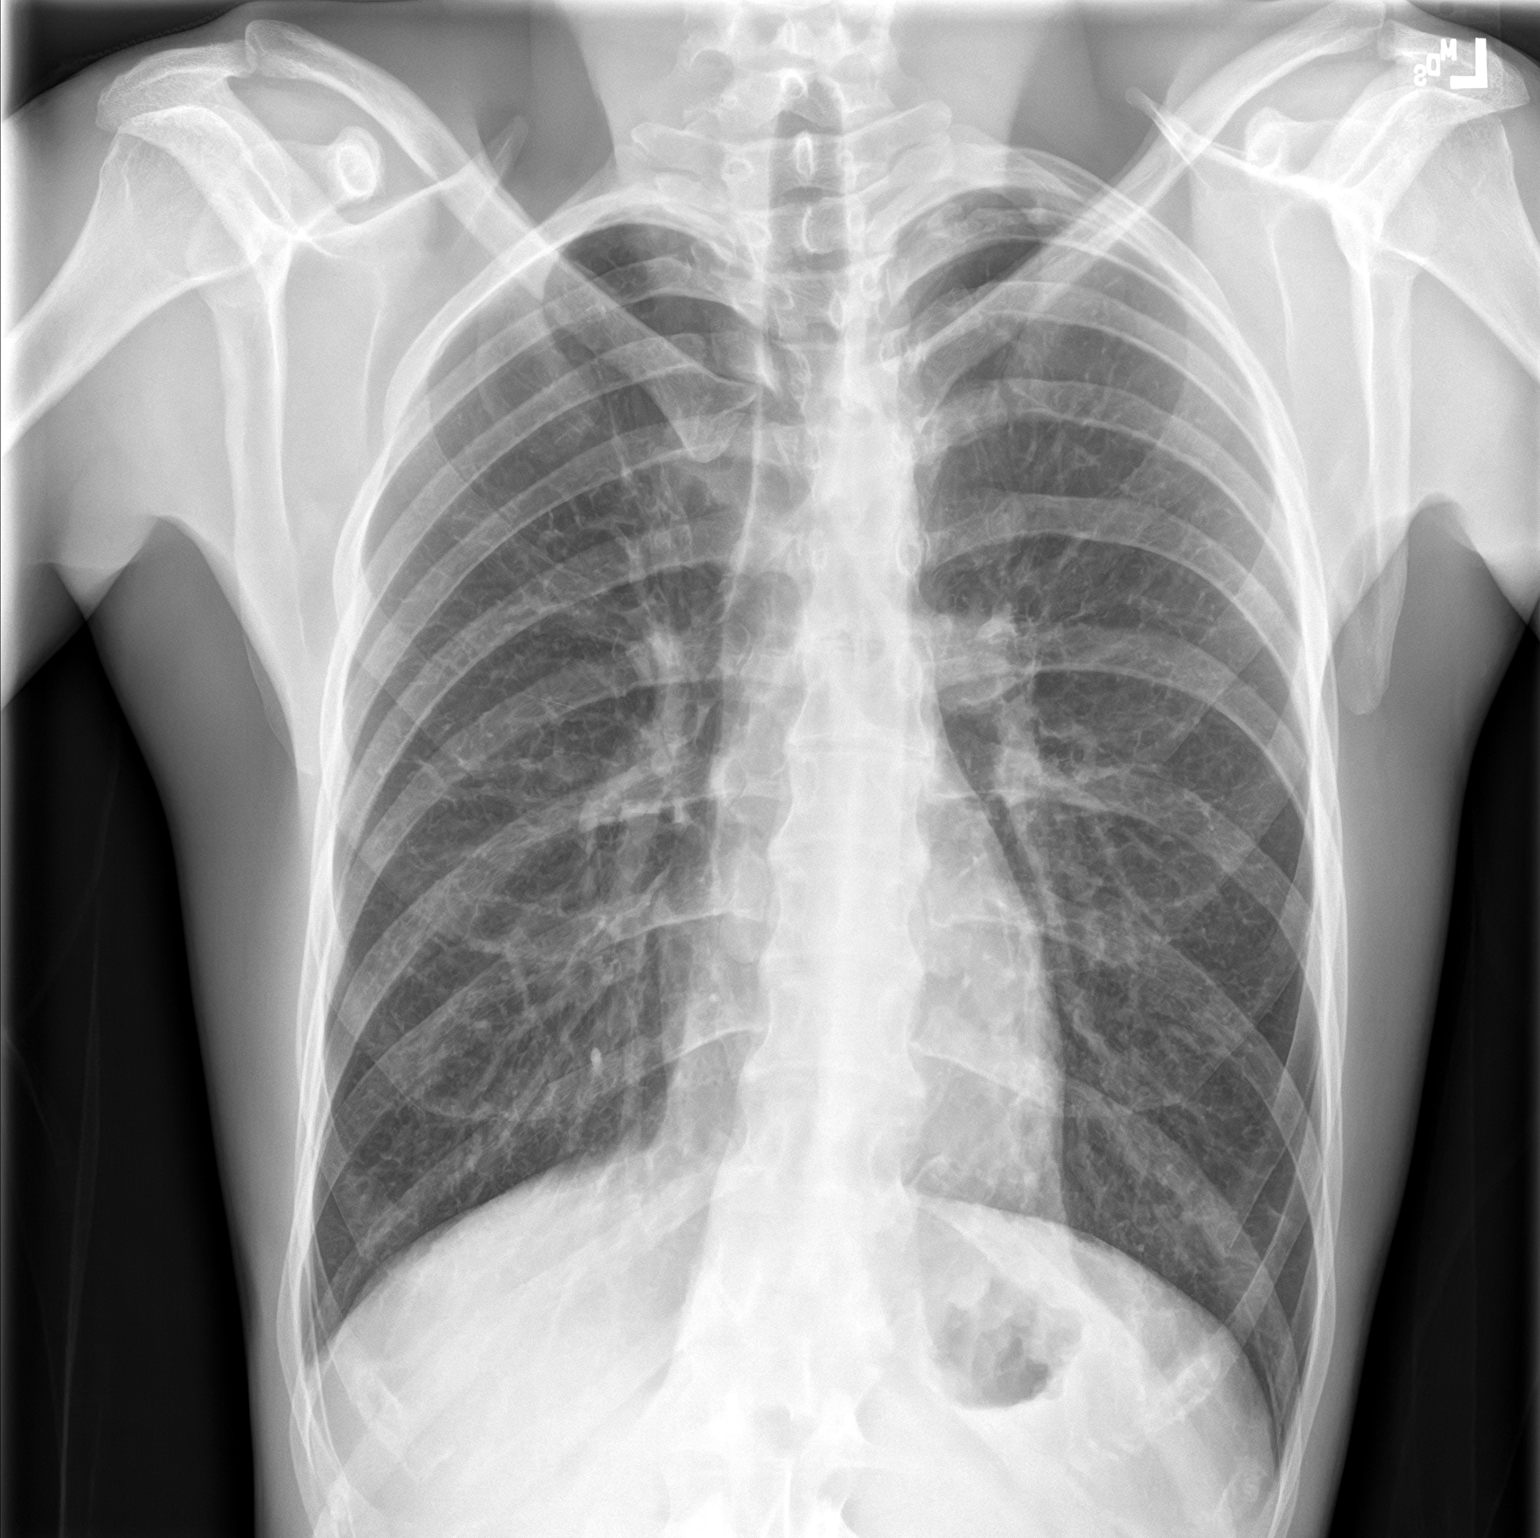
[im 2/2]
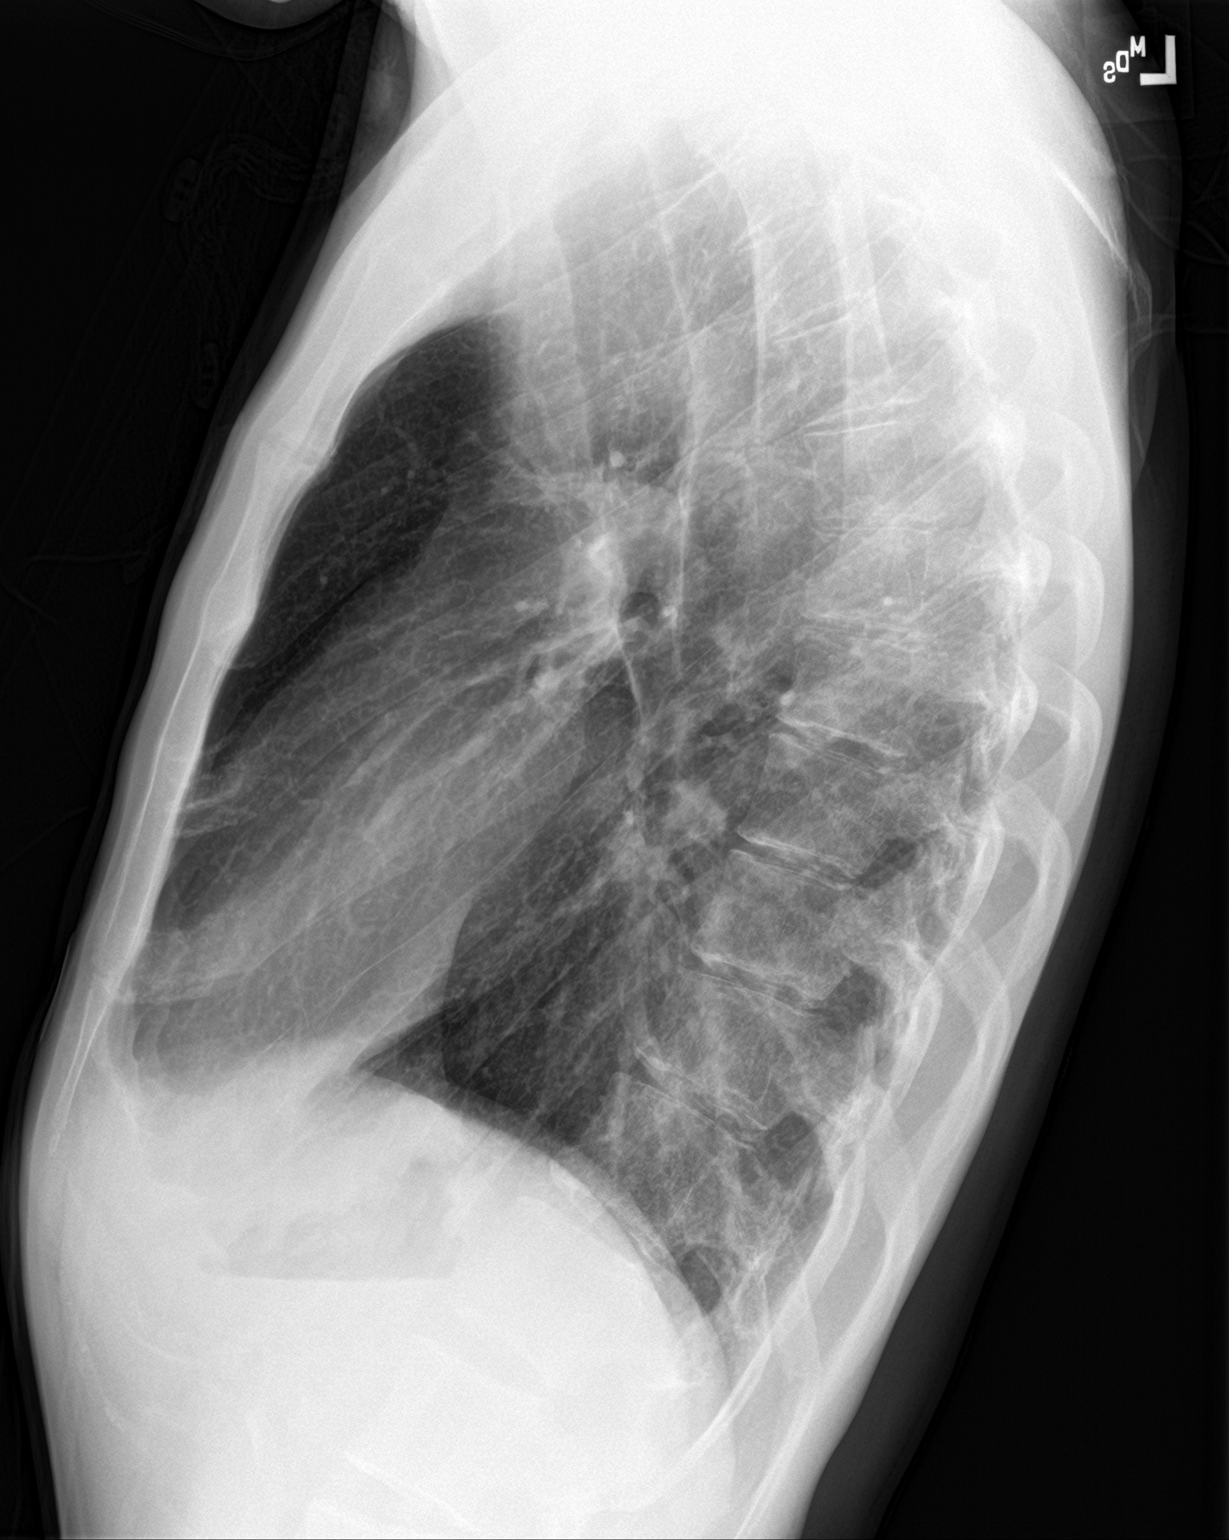

[2 of 2 positions shown; findings below may reference images not displayed]

FINDINGS: The heart size and mediastinal contours are within normal limits.
There is mild coarsening of the interstitium bilaterally likely due
to tobacco use. No focal infiltrate. No pneumothorax or pleural
effusion. The visualized skeletal structures are unremarkable.
IMPRESSION: 1.  No evidence of active disease.

2. Coarsening of the interstitium likely representing chronic
bronchitic change.

## 2021-12-06 ENCOUNTER — Encounter (HOSPITAL_COMMUNITY): Payer: Self-pay

## 2021-12-06 ENCOUNTER — Emergency Department (HOSPITAL_COMMUNITY)
Admission: EM | Admit: 2021-12-06 | Discharge: 2021-12-06 | Disposition: A | Discharge status: Home / Self Care | Payer: Self-pay | Attending: Emergency Medicine | Admitting: Emergency Medicine

## 2021-12-06 ENCOUNTER — Other Ambulatory Visit: Payer: Self-pay

## 2021-12-06 DIAGNOSIS — K029 Dental caries, unspecified: Secondary | ICD-10-CM | POA: Insufficient documentation

## 2021-12-06 MED ORDER — PENICILLIN V POTASSIUM 500 MG PO TABS: 500.0000 mg | ORAL_TABLET | Freq: Four times a day (QID) | ORAL | 0 refills | Status: AC

## 2021-12-06 NOTE — ED Provider Notes (Signed)
[facility] EMERGENCY DEPARTMENT Provider Note   CSN: [id number] Arrival date & time: 12/06/21  1337     History  Chief Complaint  Patient presents with   Dental Pain    Jesse Graves is a 26 y.o. male who presents to the ED complaining of right upper dental pain onset 2-3 days ago. Doesn't have a dentist at this time.  No meds tried prior to arrival.  Denies trouble breathing, trouble swallowing, facial swelling.  The history is provided by the patient. No language interpreter was used.       Home Medications Prior to Admission medications   Medication Sig Start Date End Date Taking? Authorizing Provider  penicillin v potassium (VEETID) 500 MG tablet Take 1 tablet (500 mg total) by mouth 4 (four) times daily for 7 days. 12/06/21 12/13/21 Yes Nyree Yonker A, PA-C  cephALEXin (KEFLEX) 500 MG capsule Take 1 capsule (500 mg total) by mouth 4 (four) times daily. 03/14/21   Sherrill Raring, PA-C  cyclobenzaprine (FLEXERIL) 10 MG tablet Take 1 tablet (10 mg total) by mouth 2 (two) times daily as needed for muscle spasms. 09/27/20   Marcello Fennel, PA-C  HYDROcodone-acetaminophen (NORCO/VICODIN) 5-325 MG tablet Take 1 tablet by mouth every 6 (six) hours as needed for moderate pain. 04/01/20   Fredia Sorrow, MD  naproxen (NAPROSYN) 500 MG tablet Take 1 tablet (500 mg total) by mouth 2 (two) times daily. 04/01/20   Fredia Sorrow, MD  predniSONE (DELTASONE) 20 MG tablet Take 3 tablets (60 mg total) by mouth daily. 40/98/11   Delora Fuel, MD      Allergies    Patient has no known allergies.    Review of Systems   Review of Systems  All other systems reviewed and are negative.   Physical Exam Updated Vital Signs BP 126/83 (BP Location: Left Arm)   Pulse 80   Temp 98.3 F (36.8 C) (Oral)   Resp 18   Ht 6\' 2"  (1.88 m)   Wt 62.1 kg   SpO2 97%   BMI 17.59 kg/m  Physical Exam Vitals and nursing note reviewed.  Constitutional:      General: He is not in acute distress.     Appearance: He is not ill-appearing.  HENT:     Head: Normocephalic and atraumatic.     Right Ear: Tympanic membrane and external ear normal.     Left Ear: External ear normal.     Nose: Nose normal.     Mouth/Throat:     Mouth: Mucous membranes are moist. Mucous membranes are not dry.     Dentition: Abnormal dentition. Dental tenderness and dental caries present.     Tongue: Tongue does not deviate from midline.     Pharynx: Oropharynx is clear. Uvula midline. No oropharyngeal exudate or posterior oropharyngeal erythema.     Tonsils: No tonsillar exudate or tonsillar abscesses.     Comments: Multiple dental caries noted throughout. Tenderness to palpation to right upper gum line. No fluctuance noted. No trismus. No retropharyngeal abscess. No peritonsillar abscess noted. Uvula midline. Eyes:     Extraocular Movements: Extraocular movements intact.  Cardiovascular:     Rate and Rhythm: Normal rate and regular rhythm.     Pulses: Normal pulses.     Heart sounds: Normal heart sounds.  Pulmonary:     Effort: Pulmonary effort is normal. No respiratory distress.     Breath sounds: Normal breath sounds.  Abdominal:     General: Bowel sounds are  normal. There is no distension.     Palpations: Abdomen is soft. There is no mass.     Tenderness: There is no abdominal tenderness.  Musculoskeletal:        General: Normal range of motion.     Cervical back: Neck supple.  Lymphadenopathy:     Head:     Right side of head: No submental, submandibular, tonsillar, preauricular or posterior auricular adenopathy.     Left side of head: No submental, submandibular, tonsillar, preauricular or posterior auricular adenopathy.     Cervical: No cervical adenopathy.  Skin:    General: Skin is warm and dry.     Findings: No rash.  Neurological:     Mental Status: He is alert.  Psychiatric:        Behavior: Behavior normal.     ED Results / Procedures / Treatments   Labs (all labs ordered are  listed, but only abnormal results are displayed) Labs Reviewed - No data to display  EKG None  Radiology No results found.  Procedures Procedures    Medications Ordered in ED Medications - No data to display  ED Course/ Medical Decision Making/ A&P                           Medical Decision Making Risk Prescription drug management.   Patient presents to the ED with right upper dental pain onset 2-3 days.  Patient does not have a dentist.  Vital signs patient afebrile.  On exam patient with Multiple dental caries noted throughout. Tenderness to palpation to right upper gum line. No fluctuance noted. No trismus. No retropharyngeal abscess. No peritonsillar abscess noted. Uvula midline. Differential diagnosis includes pharyngeal abscess, dental abscess, Ludwig's angina.    Disposition: Patient presentation suspicious for dentalgia.  No abscess requiring immediate incision and drainage.  Exam not concerning for Ludwig's angina or pharyngeal abscess. After consideration of the diagnostic results and the patients response to treatment, I feel that the patient would benefit from Discharge home. Will treat with penicillin prescription.  Provided resource guide for dentists in the area, patient structured to follow-up with a dentist. Supportive care measures and strict return precautions discussed with patient at bedside. Pt acknowledges and verbalizes understanding. Pt appears safe for discharge. Follow up as indicated in discharge paperwork.  This chart was dictated using voice recognition software, Dragon. Despite the best efforts of this provider to proofread and correct errors, errors may still occur which can change documentation meaning.  Final Clinical Impression(s) / ED Diagnoses Final diagnoses:  Pain due to dental caries    Rx / DC Orders ED Discharge Orders          Ordered    penicillin v potassium (VEETID) 500 MG tablet  4 times daily        12/06/21 1717               Archibald Marchetta A, PA-C 12/06/21 1901    Godfrey Pick, MD 12/07/21 2112

## 2021-12-06 NOTE — ED Triage Notes (Signed)
Pt presents to ED with complaints of top right tooth pain started couple days ago.

## 2021-12-06 NOTE — Discharge Instructions (Addendum)
It was a pleasure taking care of you today!   You will be prescribed Penicillin, take as directed and ensure to complete the entire course of the antibiotic. You may take over the counter 600 mg Ibuprofen every 6 hours and alternate with 500 mg Tylenol every 6 hours as directed.  Attached you will find a resource guide for dentists in the area, call and set up a follow up appointment. Attached is the information for the on-call dentist. Return to the Emergency Department if you are experiencing increasing/worsening symptoms.

## 2022-01-05 ENCOUNTER — Emergency Department: Payer: Self-pay

## 2022-01-05 ENCOUNTER — Emergency Department
Admission: EM | Admit: 2022-01-05 | Discharge: 2022-01-05 | Discharge status: Against Medical Advice | Payer: Self-pay | Attending: Emergency Medicine | Admitting: Emergency Medicine

## 2022-01-05 DIAGNOSIS — Z5321 Procedure and treatment not carried out due to patient leaving prior to being seen by health care provider: Secondary | ICD-10-CM | POA: Insufficient documentation

## 2022-01-05 DIAGNOSIS — R059 Cough, unspecified: Secondary | ICD-10-CM | POA: Insufficient documentation

## 2022-01-05 DIAGNOSIS — Z20822 Contact with and (suspected) exposure to covid-19: Secondary | ICD-10-CM | POA: Insufficient documentation

## 2022-01-05 DIAGNOSIS — R0981 Nasal congestion: Secondary | ICD-10-CM | POA: Insufficient documentation

## 2022-01-05 LAB — RESP PANEL BY RT-PCR (FLU A&B, COVID) ARPGX2
Influenza A by PCR: NEGATIVE
Influenza B by PCR: NEGATIVE
SARS Coronavirus 2 by RT PCR: NEGATIVE

## 2022-01-05 NOTE — ED Notes (Signed)
Pt came into triage rm and stated to this tech, "I need an xray. I had a collapsed lung before. If I am just sick and don't have a collapsed lung then I am going home." This tech informed pt that I would get an EKG and blood work. Pt stated, "I just want an xray." This tech informed Alycia, RN that pt just wants an xray to check to see if his lung collapsed.

## 2022-01-05 NOTE — ED Notes (Signed)
Pt came to stat desk inquiring about his chest xr results. Pt stated, "I just need to know if I have a hole in my heart or a collapsed lung. I don't need a doctor's note or anything." Pt informed that he had 2 pts in front of him. Pt stated, "I'm not trying to see a doctor. I only wanted to be here for an hour or two at the most. I just need for someone to tell me my results."  Pt informed that he would have to see a provider for his results. Pt stated, "Well someone can call me with my results. I have been waiting over 2 hours."

## 2022-01-05 NOTE — ED Triage Notes (Signed)
Pt presents via POV with complaints of nasal congestion that started 2-3 days ago. He states his kids have a cough and he wanted to be checked out. Denies CP, fevers, N/V,D.

## 2022-01-29 ENCOUNTER — Emergency Department: Payer: Self-pay

## 2022-01-29 ENCOUNTER — Encounter: Payer: Self-pay | Admitting: Emergency Medicine

## 2022-01-29 DIAGNOSIS — N451 Epididymitis: Secondary | ICD-10-CM | POA: Insufficient documentation

## 2022-01-29 LAB — URINALYSIS, ROUTINE W REFLEX MICROSCOPIC
Bacteria, UA: NONE SEEN
Bilirubin Urine: NEGATIVE
Glucose, UA: 150 mg/dL — AB
Hgb urine dipstick: NEGATIVE
Ketones, ur: NEGATIVE mg/dL
Nitrite: NEGATIVE
Protein, ur: NEGATIVE mg/dL
Specific Gravity, Urine: 1.021 (ref 1.005–1.030)
pH: 6 (ref 5.0–8.0)

## 2022-01-29 MED ORDER — OXYCODONE-ACETAMINOPHEN 5-325 MG PO TABS
1.0000 | ORAL_TABLET | Freq: Once | ORAL | Status: AC
  Administered 2022-01-29: 1 via ORAL
  Filled 2022-01-29: qty 1

## 2022-01-30 ENCOUNTER — Other Ambulatory Visit: Payer: Self-pay

## 2022-01-30 ENCOUNTER — Emergency Department
Admission: EM | Admit: 2022-01-30 | Discharge: 2022-01-30 | Discharge status: Against Medical Advice | Payer: Self-pay | Attending: Emergency Medicine | Admitting: Emergency Medicine

## 2022-01-30 DIAGNOSIS — N451 Epididymitis: Secondary | ICD-10-CM

## 2022-01-30 LAB — CHLAMYDIA/NGC RT PCR (ARMC ONLY)
Chlamydia Tr: DETECTED — AB
N gonorrhoeae: NOT DETECTED

## 2022-01-30 MED ORDER — LIDOCAINE HCL (PF) 1 % IJ SOLN
INTRAMUSCULAR | Status: AC
  Filled 2022-01-30: qty 5

## 2022-01-30 MED ORDER — CEFTRIAXONE SODIUM 1 G IJ SOLR
500.0000 mg | Freq: Once | INTRAMUSCULAR | Status: AC
  Administered 2022-01-30: 500 mg via INTRAMUSCULAR
  Filled 2022-01-30: qty 10

## 2022-01-30 MED ORDER — HYDROCODONE-ACETAMINOPHEN 5-325 MG PO TABS: 1.0000 | ORAL_TABLET | Freq: Four times a day (QID) | ORAL | 0 refills | Status: AC | PRN

## 2022-01-30 MED ORDER — DOXYCYCLINE MONOHYDRATE 100 MG PO TABS: 100.0000 mg | ORAL_TABLET | Freq: Two times a day (BID) | ORAL | 0 refills | Status: AC

## 2022-01-30 MED ORDER — DOXYCYCLINE HYCLATE 100 MG PO TABS
100.0000 mg | ORAL_TABLET | Freq: Once | ORAL | Status: AC
  Administered 2022-01-30: 100 mg via ORAL
  Filled 2022-01-30: qty 1

## 2022-01-30 NOTE — Discharge Instructions (Addendum)
You have epididymitis which is an infection of the tube which carries a sperm from the testes out to the prostate.  I will give you some antibiotics.  Doxycycline 1 pill twice a day.  Do not take it with milk or dairy products as that can inactivate the doxycycline.  I also gave you the shot here in the ER.  Please return for increasing pain or fever or any other problems.  We will call you if the urine specimen shows any STD.  Use the Vicodin 1 pill 4 times a day as needed for pain.  Tomorrow you can start using Motrin up to 4 of the over-the-counter pills 3 times a day with food.  After 3 to 4 days you should be able to cut back to 3 pills 3 times a day of the Motrin and then probably stop after about a week.  We are tightfitting supportive underwear, that should help with the pain.

## 2022-01-30 NOTE — ED Triage Notes (Signed)
Pt presents via POV with complaints of R testicle pain that started 2-3 days ago. With a chaperone, the patients scrotum is visibly swollen and he endorses pain. Rates pain 10/10. Denies fevers, CP or SOB.

## 2022-01-30 NOTE — ED Notes (Signed)
No answer when called from the lobby for repeat VS. Not visualized in the lobby.

## 2022-01-30 NOTE — ED Provider Notes (Signed)
[facility] Provider Note    Event Date/Time   First MD Initiated Contact with Patient 01/30/22 [phone]     (approximate)   History   Testicle Pain   HPI  Jesse Graves is a 26 y.o. male who reports pain and swelling in his right scrotum.  Patient reports his girlfriend was sleeping with someone else and they broke up 2 weeks ago.  He was tested for HIV and STDs then.  He has not slept with anyone else since then.  He does not have a discharge.      Physical Exam   Triage Vital Signs: ED Triage Vitals  Enc Vitals Group     BP 01/29/22 2322 120/74     Pulse Rate 01/29/22 2322 (!) 107     Resp 01/29/22 2322 18     Temp 01/29/22 2322 98.2 F (36.8 C)     Temp Source 01/29/22 2322 Oral     SpO2 01/29/22 2322 98 %     Weight 01/29/22 2322 140 lb (63.5 kg)     Height 01/29/22 2322 6\' 2"  (1.88 m)     Head Circumference --      Peak Flow --      Pain Score 01/29/22 2324 10     Pain Loc --      Pain Edu? --      Excl. in GC? --     Most recent vital signs: Vitals:   01/29/22 2322 01/30/22 0750  BP: 120/74 115/68  Pulse: (!) 107 96  Resp: 18 18  Temp: 98.2 F (36.8 C) 98 F (36.7 C)  SpO2: 98% 98%    General: Awake, no distress.  CV:  Good peripheral perfusion.  Resp:  Normal effort.  Abd:  No distention.  GU: Normal male.  Right epididymis is firm and tender.  Testes both feel normal and really nontender.   ED Results / Procedures /reatments   Labs (all labs ordered are listed, but only abnormal results are displayed) Labs Reviewed  URINALYSIS, ROUTINE W REFLEX MICROSCOPIC - Abnormal; Notable for the following components:      Result Value   Color, Urine YELLOW (*)    APPearance HAZY (*)    Glucose, UA 150 (*)    Leukocytes,Ua SMALL (*)    All other components within normal limits  CHLAMYDIA/NGC RT PCR [facility] ONLY)               EKG     RADIOLOGY Ultrasound shows good blood flow to both testes.  The right epididymis is  inflamed radiologist diagnosis epididymitis.   PROCEDURES:  Critical Care performed:   Procedures   MEDICATIONS ORDERED IN ED: Medications  cefTRIAXone (ROCEPHIN) injection 500 mg (has no administration in time range)  doxycycline (VIBRA-TABS) tablet 100 mg (has no administration in time range)  oxyCODONE-acetaminophen (PERCOCET/ROXICET) 5-325 MG per tablet 1 tablet (1 tablet Oral Given 01/29/22 2329)     IMPRESSION / MDM / ASSESSMENT AND PLAN / ED COURSE  I reviewed the triage vital signs and the nursing notes.  Will give the patient 4 Vicodin 5/325 for pain and then Motrin.  He will get a shot of Rocephin 500 and Doxy cycling 100 mg twice a day for 10 days.  Differential diagnosis includes, but is not limited to, testicular torsion, testicular cancer, torsion of the appendix testes, orchitis, epididymoorchitis or epididymitis.  Patient's presentation is most consistent with acute complicated illness / injury requiring diagnostic workup.  FINAL CLINICAL IMPRESSION(S) / ED DIAGNOSES   Final diagnoses:  Epididymitis     Rx / DC Orders   ED Discharge Orders          Ordered    HYDROcodone-acetaminophen (NORCO/VICODIN) 5-325 MG tablet  Every 6 hours PRN        01/30/22 0814    doxycycline (ADOXA) 100 MG tablet  2 times daily        01/30/22 7829             Note:  This document was prepared using Dragon voice recognition software and may include unintentional dictation errors.   Arnaldo Natal, MD 01/30/22 [phone]

## 2022-02-08 ENCOUNTER — Telehealth: Payer: Self-pay | Admitting: Emergency Medicine

## 2022-02-08 NOTE — Telephone Encounter (Signed)
Called patient to inform of std results.  Gave results.  Was treated in ED. Explained partner treatment and that if they had had intercourse since, that he may need additional treatment--advised to go to [facility] for further treatment and partner treatment.

## 2022-02-11 IMAGING — DX DG CHEST 1V PORT
1 series · 1 of 1 positions shown · non-contrast
Comparison: 05/08/2019 at [DATE] p.m.

CLINICAL DATA: Chest tube placement, left-sided pneumothorax

EXAM:
PORTABLE CHEST 1 VIEW

[chest ap]
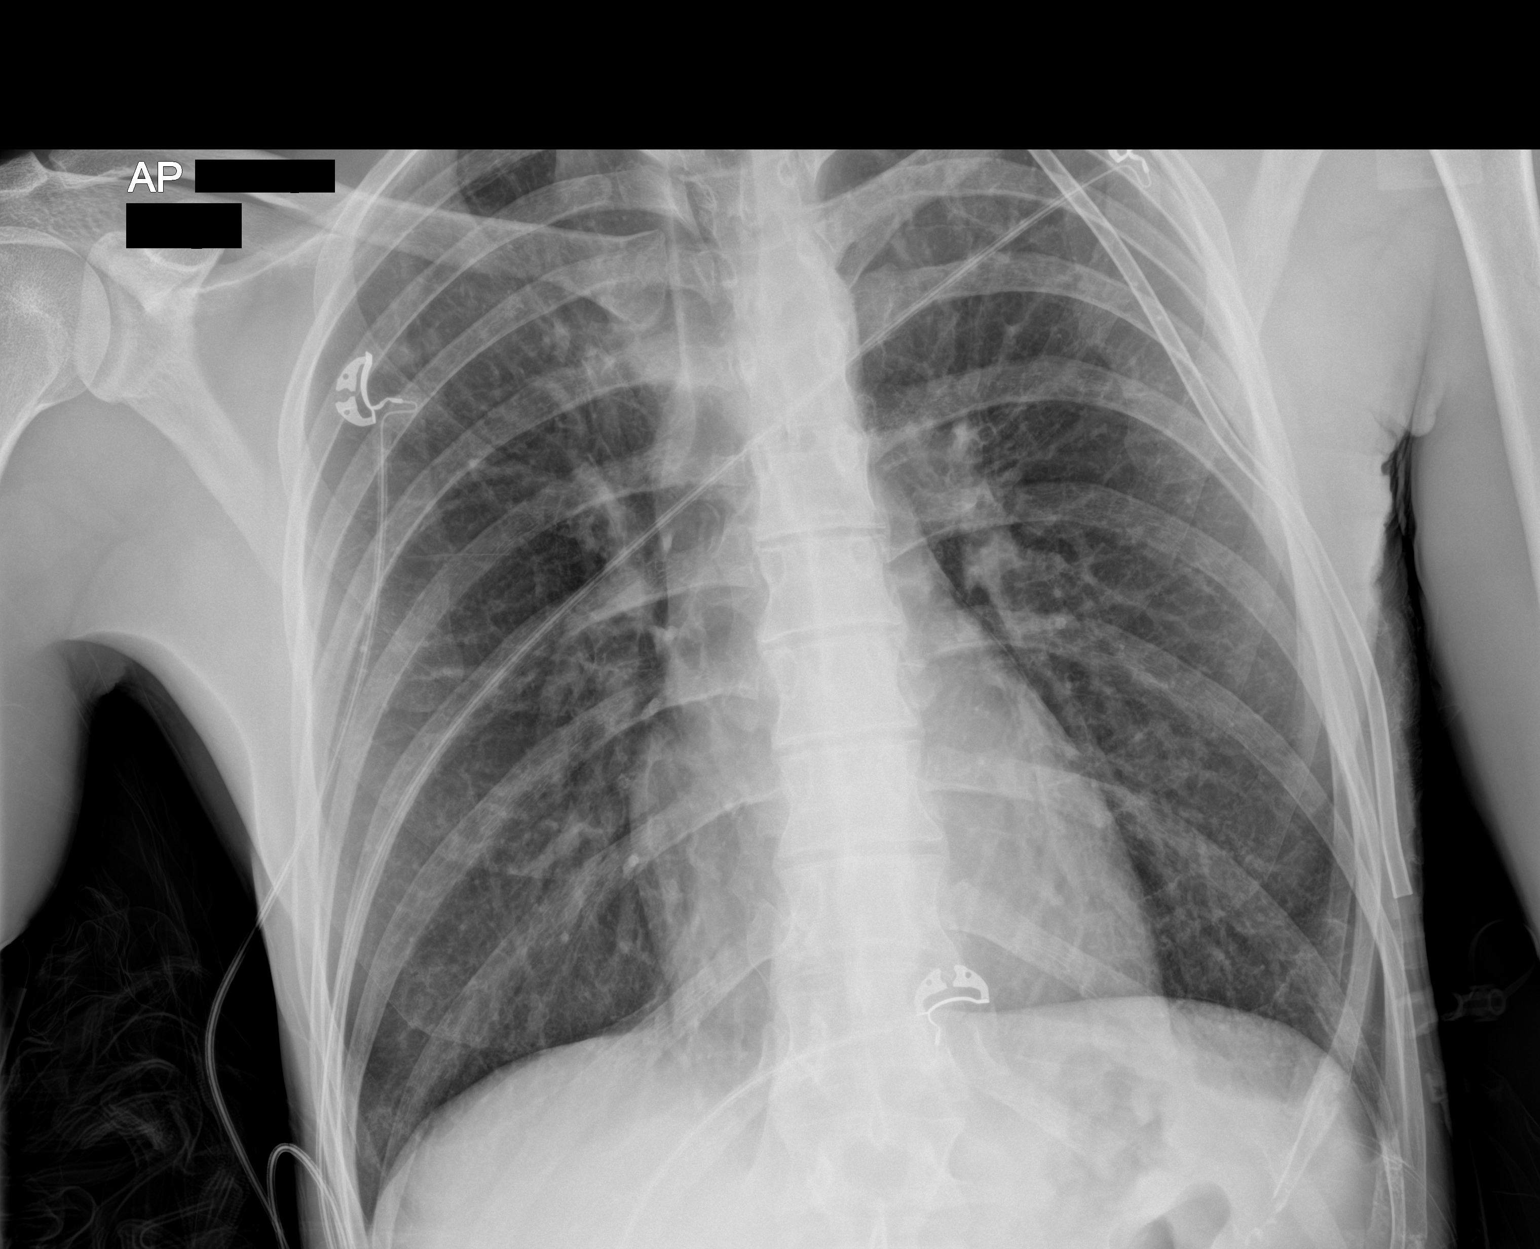

[1 of 1 positions shown; findings below may reference images not displayed]

FINDINGS: Single frontal view of the chest demonstrates interval placement of
a left-sided chest tube, tip coiled over the left apex. Significant
reduction in left-sided pneumothorax volume estimated less than 5%
remaining. No airspace disease or effusion. The cardiac silhouette
is stable.
IMPRESSION: 1. Near complete resolution of left pneumothorax after left chest
tube placement. Less than 5% residual pneumothorax.

## 2022-03-03 ENCOUNTER — Other Ambulatory Visit: Payer: Self-pay

## 2022-03-03 ENCOUNTER — Encounter (HOSPITAL_COMMUNITY): Payer: Self-pay | Admitting: *Deleted

## 2022-03-03 ENCOUNTER — Emergency Department (HOSPITAL_COMMUNITY)
Admission: EM | Admit: 2022-03-03 | Discharge: 2022-03-03 | Disposition: A | Discharge status: Home / Self Care | Payer: Self-pay | Attending: Emergency Medicine | Admitting: Emergency Medicine

## 2022-03-03 DIAGNOSIS — U071 COVID-19: Secondary | ICD-10-CM | POA: Insufficient documentation

## 2022-03-03 DIAGNOSIS — R051 Acute cough: Secondary | ICD-10-CM

## 2022-03-03 DIAGNOSIS — R509 Fever, unspecified: Secondary | ICD-10-CM

## 2022-03-03 LAB — RESP PANEL BY RT-PCR (RSV, FLU A&B, COVID)  RVPGX2
Influenza A by PCR: NEGATIVE
Influenza B by PCR: NEGATIVE
Resp Syncytial Virus by PCR: NEGATIVE
SARS Coronavirus 2 by RT PCR: POSITIVE — AB

## 2022-03-03 NOTE — ED Provider Notes (Signed)
[facility] Provider Note   CSN: [id number] Arrival date & time: 03/03/22  1911     History  Chief Complaint  Patient presents with   Cough    Jesse Graves is a 27 y.o. male with history of seizures who presents the emergency department complaining of productive cough, fever, and chills for the past 2 days.  States that his boss was recently diagnosed with the flu.  Patient been taking Tylenol with some relief.  He overall says that he feels better today compared to yesterday.   Cough Associated symptoms: chills        Home Medications Prior to Admission medications   Medication Sig Start Date End Date Taking? Authorizing Provider  cephALEXin (KEFLEX) 500 MG capsule Take 1 capsule (500 mg total) by mouth 4 (four) times daily. 03/14/21   Sherrill Raring, PA-C  cyclobenzaprine (FLEXERIL) 10 MG tablet Take 1 tablet (10 mg total) by mouth 2 (two) times daily as needed for muscle spasms. 09/27/20   Marcello Fennel, PA-C  doxycycline (ADOXA) 100 MG tablet Take 1 tablet (100 mg total) by mouth 2 (two) times daily. 01/30/22   Nena Polio, MD  HYDROcodone-acetaminophen (NORCO/VICODIN) 5-325 MG tablet Take 1 tablet by mouth every 6 (six) hours as needed for moderate pain. 04/01/20   Fredia Sorrow, MD  HYDROcodone-acetaminophen (NORCO/VICODIN) 5-325 MG tablet Take 1 tablet by mouth every 6 (six) hours as needed for moderate pain. 01/30/22   Nena Polio, MD  naproxen (NAPROSYN) 500 MG tablet Take 1 tablet (500 mg total) by mouth 2 (two) times daily. 04/01/20   Fredia Sorrow, MD  predniSONE (DELTASONE) 20 MG tablet Take 3 tablets (60 mg total) by mouth daily. 42/70/62   Delora Fuel, MD      Allergies    Patient has no known allergies.    Review of Systems   Review of Systems  Constitutional:  Positive for chills.  HENT:  Positive for congestion.   Respiratory:  Positive for cough.   Gastrointestinal:  Positive for nausea.  All  other systems reviewed and are negative.   Physical Exam Updated Vital Signs BP 101/61 (BP Location: Left Arm)   Pulse 92   Temp 97.9 F (36.6 C) (Oral)   Resp 16   Ht 6\' 2"  (1.88 m)   Wt 59.4 kg   SpO2 96%   BMI 16.82 kg/m  Physical Exam Vitals and nursing note reviewed.  Constitutional:      Appearance: Normal appearance.  HENT:     Head: Normocephalic and atraumatic.     Nose: Congestion present.  Eyes:     Conjunctiva/sclera: Conjunctivae normal.  Cardiovascular:     Rate and Rhythm: Normal rate and regular rhythm.  Pulmonary:     Effort: Pulmonary effort is normal. No respiratory distress.     Breath sounds: Normal breath sounds.  Abdominal:     General: There is no distension.     Palpations: Abdomen is soft.     Tenderness: There is no abdominal tenderness.  Skin:    General: Skin is warm and dry.  Neurological:     General: No focal deficit present.     Mental Status: He is alert.     ED Results / Procedures / Treatments   Labs (all labs ordered are listed, but only abnormal results are displayed) Labs Reviewed  RESP PANEL BY RT-PCR (RSV, FLU A&B, COVID)  [id number] - Abnormal; Notable for the following  components:      Result Value   SARS Coronavirus 2 by RT PCR POSITIVE (*)    All other components within normal limits    EKG None  Radiology No results found.  Procedures Procedures    Medications Ordered in ED Medications - No data to display  ED Course/ Medical Decision Making/ A&P                             Medical Decision Making  This patient is a 27 y.o. male who presents to the ED for concern of chills and productive cough x2 days.   Differential diagnoses prior to evaluation: The emergent differential diagnosis includes, but is not limited to,  upper respiratory infection, lower respiratory infection, allergies, asthma, irritants, sinus/esophageal foreign body, medications, reflux, interstitial lung disease, postnasal drip, viral  illness, sepsis. This is not an exhaustive differential.   Past Medical History / Co-morbidities / Additional history: Chart reviewed. Pertinent results include: seizures  Physical Exam: Physical exam performed. The pertinent findings include: Vital signs, no acute distress.  Lung sounds clear, normal oxygen saturation on room air.  Lab Tests/Imaging studies: I personally interpreted labs/imaging and the pertinent results include:  respiratory panel positive for COVID.   Disposition: After consideration of the diagnostic results and the patients response to treatment, I feel that emergency department workup does not suggest an emergent condition requiring admission or immediate intervention beyond what has been performed at this time. Patient with symptoms consistent with COVID viral infection.  Vitals are stable.  No signs of dehydration, tolerating PO's.  Lungs are clear.   The plan is: Patient will be discharged with instructions to orally hydrate, rest, and use over-the-counter medications such as anti-inflammatories such as ibuprofen and Tylenol for fever.  The patient is safe for discharge and has been instructed to return immediately for worsening symptoms, change in symptoms or any other concerns.  Final Clinical Impression(s) / ED Diagnoses Final diagnoses:  COVID-19  Acute cough  Fever, unspecified fever cause    Rx / DC Orders ED Discharge Orders     None      Portions of this report may have been transcribed using voice recognition software. Every effort was made to ensure accuracy; however, inadvertent computerized transcription errors may be present.    Jesse Graves 03/03/22 2039    Hayden Rasmussen, MD 03/04/22 [phone]

## 2022-03-03 NOTE — Discharge Instructions (Signed)
You were seen in the emergency department today for cough and fever.  As we discussed your COVID test is positive.  This is a viral illness very common at this time of year, and we normally treat with over-the-counter medications.  Symptoms can last for up to a week.  You can take ibuprofen or Tylenol for pain or fever, and I recommend alternating between the 2.  Make sure that you are drinking lots of fluids and getting plenty of rest. You can take decongestants as long as you take them with lots of water. You can use lozenges or chloraseptic spray as needed for sore throat.   Please use Tylenol or ibuprofen for pain.  You may use 600 mg ibuprofen every 6 hours or 1000 mg of Tylenol every 6 hours.  You may choose to alternate between the 2.  This would be most effective.  Do not exceed 4 g of Tylenol within 24 hours.  Do not exceed 3200 mg ibuprofen within 24 hours.  Continue to monitor how you are doing, and return to the emergency department for new or worsening symptoms such as chest pain, difficulty breathing not related to coughing, fever despite medication, or persistent vomiting or diarrhea.  It has been a pleasure taking care of you today and I hope you begin to feel better soon!

## 2022-03-03 NOTE — ED Triage Notes (Signed)
Pt with productive cough started 2 days ago. States boss has been sick, concerned for flu.  Pt states fever last night. + chills. + nausea
